# Patient Record
Sex: Female | Born: 1955
Health system: Southern US, Community
[De-identification: ages and names within clinical notes are randomized; demographics above are authoritative.]

## PROBLEM LIST (undated history)

## (undated) DIAGNOSIS — M1990 Unspecified osteoarthritis, unspecified site: Secondary | ICD-10-CM

## (undated) DIAGNOSIS — H409 Unspecified glaucoma: Secondary | ICD-10-CM

## (undated) DIAGNOSIS — E7800 Pure hypercholesterolemia, unspecified: Secondary | ICD-10-CM

## (undated) DIAGNOSIS — E042 Nontoxic multinodular goiter: Secondary | ICD-10-CM

## (undated) DIAGNOSIS — K589 Irritable bowel syndrome without diarrhea: Secondary | ICD-10-CM

## (undated) DIAGNOSIS — I10 Essential (primary) hypertension: Secondary | ICD-10-CM

## (undated) DIAGNOSIS — M545 Low back pain, unspecified: Secondary | ICD-10-CM

## (undated) DIAGNOSIS — C649 Malignant neoplasm of unspecified kidney, except renal pelvis: Secondary | ICD-10-CM

## (undated) DIAGNOSIS — K219 Gastro-esophageal reflux disease without esophagitis: Secondary | ICD-10-CM

## (undated) DIAGNOSIS — T7840XA Allergy, unspecified, initial encounter: Secondary | ICD-10-CM

## (undated) DIAGNOSIS — E663 Overweight: Secondary | ICD-10-CM

## (undated) DIAGNOSIS — I872 Venous insufficiency (chronic) (peripheral): Secondary | ICD-10-CM

## (undated) HISTORY — DX: Unspecified glaucoma: H40.9

## (undated) HISTORY — DX: Malignant neoplasm of unspecified kidney, except renal pelvis: C64.9

## (undated) HISTORY — DX: Overweight: E66.3

## (undated) HISTORY — DX: Essential (primary) hypertension: I10

## (undated) HISTORY — DX: Nontoxic multinodular goiter: E04.2

## (undated) HISTORY — DX: Pure hypercholesterolemia, unspecified: E78.00

## (undated) HISTORY — DX: Allergy, unspecified, initial encounter: T78.40XA

## (undated) HISTORY — PX: CHOLECYSTECTOMY: SHX55

## (undated) HISTORY — DX: Low back pain: M54.5

## (undated) HISTORY — PX: ABDOMINAL HYSTERECTOMY: SHX81

## (undated) HISTORY — DX: Venous insufficiency (chronic) (peripheral): I87.2

## (undated) HISTORY — DX: Unspecified osteoarthritis, unspecified site: M19.90

## (undated) HISTORY — DX: Low back pain, unspecified: M54.50

## (undated) HISTORY — DX: Gastro-esophageal reflux disease without esophagitis: K21.9

## (undated) HISTORY — DX: Irritable bowel syndrome, unspecified: K58.9

---

## 1998-12-30 ENCOUNTER — Other Ambulatory Visit: Admission: RE | Admit: 1998-12-30 | Discharge: 1998-12-30 | Payer: Self-pay | Admitting: Obstetrics and Gynecology

## 1999-01-09 ENCOUNTER — Observation Stay (HOSPITAL_COMMUNITY): Admission: RE | Admit: 1999-01-09 | Discharge: 1999-01-10 | Payer: Self-pay | Admitting: Obstetrics and Gynecology

## 2000-06-19 ENCOUNTER — Emergency Department (HOSPITAL_COMMUNITY): Admission: EM | Admit: 2000-06-19 | Discharge: 2000-06-19 | Payer: Self-pay | Admitting: Emergency Medicine

## 2000-08-07 ENCOUNTER — Encounter: Payer: Self-pay | Admitting: Obstetrics and Gynecology

## 2000-08-07 ENCOUNTER — Encounter: Admission: RE | Admit: 2000-08-07 | Discharge: 2000-08-07 | Payer: Self-pay | Admitting: Obstetrics and Gynecology

## 2001-09-08 ENCOUNTER — Encounter: Payer: Self-pay | Admitting: Obstetrics and Gynecology

## 2001-09-08 ENCOUNTER — Encounter: Admission: RE | Admit: 2001-09-08 | Discharge: 2001-09-08 | Payer: Self-pay | Admitting: Obstetrics and Gynecology

## 2002-08-19 ENCOUNTER — Other Ambulatory Visit: Admission: RE | Admit: 2002-08-19 | Discharge: 2002-08-19 | Payer: Self-pay | Admitting: Obstetrics and Gynecology

## 2003-01-25 ENCOUNTER — Encounter: Admission: RE | Admit: 2003-01-25 | Discharge: 2003-01-25 | Payer: Self-pay | Admitting: Obstetrics and Gynecology

## 2003-01-25 ENCOUNTER — Encounter: Payer: Self-pay | Admitting: Obstetrics and Gynecology

## 2003-11-27 HISTORY — PX: OTHER SURGICAL HISTORY: SHX169

## 2004-03-13 ENCOUNTER — Encounter: Admission: RE | Admit: 2004-03-13 | Discharge: 2004-03-13 | Payer: Self-pay | Admitting: Obstetrics and Gynecology

## 2004-07-25 ENCOUNTER — Encounter (HOSPITAL_COMMUNITY): Admission: RE | Admit: 2004-07-25 | Discharge: 2004-08-22 | Payer: Self-pay | Admitting: Pulmonary Disease

## 2004-08-04 ENCOUNTER — Ambulatory Visit (HOSPITAL_COMMUNITY): Admission: RE | Admit: 2004-08-04 | Discharge: 2004-08-04 | Payer: Self-pay | Admitting: Pulmonary Disease

## 2004-08-10 ENCOUNTER — Ambulatory Visit (HOSPITAL_COMMUNITY): Admission: RE | Admit: 2004-08-10 | Discharge: 2004-08-10 | Payer: Self-pay | Admitting: Pulmonary Disease

## 2004-08-29 ENCOUNTER — Ambulatory Visit (HOSPITAL_COMMUNITY): Admission: RE | Admit: 2004-08-29 | Discharge: 2004-08-29 | Payer: Self-pay | Admitting: Urology

## 2004-09-11 ENCOUNTER — Inpatient Hospital Stay (HOSPITAL_COMMUNITY): Admission: RE | Admit: 2004-09-11 | Discharge: 2004-09-14 | Payer: Self-pay | Admitting: Urology

## 2004-09-11 ENCOUNTER — Encounter (INDEPENDENT_AMBULATORY_CARE_PROVIDER_SITE_OTHER): Payer: Self-pay | Admitting: Specialist

## 2004-11-22 ENCOUNTER — Ambulatory Visit (HOSPITAL_COMMUNITY): Admission: RE | Admit: 2004-11-22 | Discharge: 2004-11-22 | Payer: Self-pay | Admitting: Urology

## 2004-11-23 ENCOUNTER — Ambulatory Visit: Payer: Self-pay | Admitting: Pulmonary Disease

## 2004-12-20 ENCOUNTER — Ambulatory Visit: Payer: Self-pay | Admitting: Pulmonary Disease

## 2005-02-21 ENCOUNTER — Ambulatory Visit: Payer: Self-pay | Admitting: Pulmonary Disease

## 2005-04-03 ENCOUNTER — Ambulatory Visit (HOSPITAL_COMMUNITY): Admission: RE | Admit: 2005-04-03 | Discharge: 2005-04-03 | Payer: Self-pay | Admitting: Urology

## 2005-04-10 ENCOUNTER — Encounter: Admission: RE | Admit: 2005-04-10 | Discharge: 2005-04-10 | Payer: Self-pay | Admitting: Pulmonary Disease

## 2005-04-20 ENCOUNTER — Encounter: Admission: RE | Admit: 2005-04-20 | Discharge: 2005-04-20 | Payer: Self-pay | Admitting: Pulmonary Disease

## 2005-05-08 ENCOUNTER — Ambulatory Visit: Payer: Self-pay | Admitting: Pulmonary Disease

## 2005-09-17 ENCOUNTER — Ambulatory Visit: Payer: Self-pay | Admitting: Pulmonary Disease

## 2005-10-03 ENCOUNTER — Ambulatory Visit (HOSPITAL_COMMUNITY): Admission: RE | Admit: 2005-10-03 | Discharge: 2005-10-03 | Payer: Self-pay | Admitting: Urology

## 2005-12-18 ENCOUNTER — Ambulatory Visit: Payer: Self-pay | Admitting: Pulmonary Disease

## 2006-02-05 ENCOUNTER — Ambulatory Visit: Payer: Self-pay | Admitting: Pulmonary Disease

## 2006-05-06 ENCOUNTER — Encounter: Admission: RE | Admit: 2006-05-06 | Discharge: 2006-05-06 | Payer: Self-pay | Admitting: Obstetrics and Gynecology

## 2006-05-21 ENCOUNTER — Ambulatory Visit: Payer: Self-pay | Admitting: Pulmonary Disease

## 2006-06-10 ENCOUNTER — Other Ambulatory Visit: Admission: RE | Admit: 2006-06-10 | Discharge: 2006-06-10 | Payer: Self-pay | Admitting: Obstetrics and Gynecology

## 2006-06-10 ENCOUNTER — Ambulatory Visit: Payer: Self-pay | Admitting: Pulmonary Disease

## 2006-10-01 ENCOUNTER — Ambulatory Visit (HOSPITAL_COMMUNITY): Admission: RE | Admit: 2006-10-01 | Discharge: 2006-10-01 | Payer: Self-pay | Admitting: Urology

## 2007-04-08 ENCOUNTER — Ambulatory Visit (HOSPITAL_COMMUNITY): Admission: RE | Admit: 2007-04-08 | Discharge: 2007-04-08 | Payer: Self-pay | Admitting: Urology

## 2007-06-06 ENCOUNTER — Ambulatory Visit: Payer: Self-pay | Admitting: Pulmonary Disease

## 2007-06-06 LAB — CONVERTED CEMR LAB
Alkaline Phosphatase: 78 units/L (ref 39–117)
BUN: 12 mg/dL (ref 6–23)
Bilirubin, Direct: 0.1 mg/dL (ref 0.0–0.3)
CO2: 27 meq/L (ref 19–32)
Calcium: 9.5 mg/dL (ref 8.4–10.5)
Creatinine, Ser: 1.1 mg/dL (ref 0.4–1.2)
Eosinophils Absolute: 0.2 10*3/uL (ref 0.0–0.6)
Eosinophils Relative: 2.3 % (ref 0.0–5.0)
Glucose, Bld: 95 mg/dL (ref 70–99)
Hemoglobin: 13.5 g/dL (ref 12.0–15.0)
Lymphocytes Relative: 23.8 % (ref 12.0–46.0)
MCV: 93.2 fL (ref 78.0–100.0)
Neutro Abs: 6.2 10*3/uL (ref 1.4–7.7)
Potassium: 4.6 meq/L (ref 3.5–5.1)
RBC: 4.17 M/uL (ref 3.87–5.11)
RDW: 11.8 % (ref 11.5–14.6)
Total Bilirubin: 0.7 mg/dL (ref 0.3–1.2)

## 2007-09-03 ENCOUNTER — Encounter: Admission: RE | Admit: 2007-09-03 | Discharge: 2007-09-03 | Payer: Self-pay | Admitting: Pulmonary Disease

## 2007-10-15 ENCOUNTER — Ambulatory Visit (HOSPITAL_COMMUNITY): Admission: RE | Admit: 2007-10-15 | Discharge: 2007-10-15 | Payer: Self-pay | Admitting: Urology

## 2008-01-20 DIAGNOSIS — I10 Essential (primary) hypertension: Secondary | ICD-10-CM | POA: Insufficient documentation

## 2008-01-20 DIAGNOSIS — M545 Low back pain, unspecified: Secondary | ICD-10-CM | POA: Insufficient documentation

## 2008-01-20 DIAGNOSIS — K219 Gastro-esophageal reflux disease without esophagitis: Secondary | ICD-10-CM | POA: Insufficient documentation

## 2008-01-20 DIAGNOSIS — K589 Irritable bowel syndrome without diarrhea: Secondary | ICD-10-CM | POA: Insufficient documentation

## 2008-06-07 ENCOUNTER — Telehealth (INDEPENDENT_AMBULATORY_CARE_PROVIDER_SITE_OTHER): Payer: Self-pay | Admitting: *Deleted

## 2008-07-19 ENCOUNTER — Telehealth: Payer: Self-pay | Admitting: Pulmonary Disease

## 2008-07-21 ENCOUNTER — Ambulatory Visit: Payer: Self-pay | Admitting: Pulmonary Disease

## 2008-07-21 DIAGNOSIS — E042 Nontoxic multinodular goiter: Secondary | ICD-10-CM | POA: Insufficient documentation

## 2008-07-21 DIAGNOSIS — M1990 Unspecified osteoarthritis, unspecified site: Secondary | ICD-10-CM | POA: Insufficient documentation

## 2008-07-22 LAB — CONVERTED CEMR LAB
AST: 19 units/L (ref 0–37)
Albumin: 3.5 g/dL (ref 3.5–5.2)
Alkaline Phosphatase: 80 units/L (ref 39–117)
BUN: 11 mg/dL (ref 6–23)
Basophils Relative: 0.6 % (ref 0.0–3.0)
Bilirubin, Direct: 0.1 mg/dL (ref 0.0–0.3)
Calcium: 8.9 mg/dL (ref 8.4–10.5)
Eosinophils Relative: 3.8 % (ref 0.0–5.0)
GFR calc non Af Amer: 62 mL/min
HDL: 35.4 mg/dL — ABNORMAL LOW (ref >39.0)
Ketones, ur: NEGATIVE mg/dL
LDL Cholesterol: 130 mg/dL — ABNORMAL HIGH (ref 0–99)
Lymphocytes Relative: 32.5 % (ref 12.0–46.0)
MCHC: 34.8 g/dL (ref 30.0–36.0)
MCV: 94.9 fL (ref 78.0–100.0)
Neutro Abs: 4.1 10*3/uL (ref 1.4–7.7)
Neutrophils Relative %: 58.3 % (ref 43.0–77.0)
Nitrite: NEGATIVE
Platelets: 257 10*3/uL (ref 150–400)
RBC: 4.1 M/uL (ref 3.87–5.11)
RDW: 12.2 % (ref 11.5–14.6)
Sodium: 142 meq/L (ref 135–145)
TSH: 0.27 microintl units/mL — ABNORMAL LOW (ref 0.35–5.50)
Total Bilirubin: 0.7 mg/dL (ref 0.3–1.2)
Total CHOL/HDL Ratio: 5.3
Urine Glucose: NEGATIVE mg/dL
VLDL: 22 mg/dL (ref 0–40)
pH: 8 (ref 5.0–8.0)

## 2008-07-29 ENCOUNTER — Telehealth (INDEPENDENT_AMBULATORY_CARE_PROVIDER_SITE_OTHER): Payer: Self-pay | Admitting: *Deleted

## 2008-07-30 ENCOUNTER — Telehealth (INDEPENDENT_AMBULATORY_CARE_PROVIDER_SITE_OTHER): Payer: Self-pay | Admitting: *Deleted

## 2008-09-07 ENCOUNTER — Encounter: Admission: RE | Admit: 2008-09-07 | Discharge: 2008-09-07 | Payer: Self-pay | Admitting: Obstetrics and Gynecology

## 2008-09-14 ENCOUNTER — Ambulatory Visit: Payer: Self-pay | Admitting: Pulmonary Disease

## 2008-09-17 ENCOUNTER — Encounter: Admission: RE | Admit: 2008-09-17 | Discharge: 2008-09-17 | Payer: Self-pay | Admitting: Obstetrics and Gynecology

## 2008-09-19 DIAGNOSIS — E7800 Pure hypercholesterolemia, unspecified: Secondary | ICD-10-CM | POA: Insufficient documentation

## 2008-12-04 ENCOUNTER — Encounter: Payer: Self-pay | Admitting: Pulmonary Disease

## 2009-02-14 ENCOUNTER — Telehealth (INDEPENDENT_AMBULATORY_CARE_PROVIDER_SITE_OTHER): Payer: Self-pay | Admitting: *Deleted

## 2009-02-17 ENCOUNTER — Ambulatory Visit: Payer: Self-pay | Admitting: Pulmonary Disease

## 2009-02-17 DIAGNOSIS — I872 Venous insufficiency (chronic) (peripheral): Secondary | ICD-10-CM | POA: Insufficient documentation

## 2009-02-17 DIAGNOSIS — E663 Overweight: Secondary | ICD-10-CM | POA: Insufficient documentation

## 2009-05-05 ENCOUNTER — Ambulatory Visit: Payer: Self-pay | Admitting: Pulmonary Disease

## 2009-05-05 DIAGNOSIS — L259 Unspecified contact dermatitis, unspecified cause: Secondary | ICD-10-CM | POA: Insufficient documentation

## 2009-05-09 LAB — CONVERTED CEMR LAB
ALT: 25 units/L (ref 0–35)
Albumin: 3.9 g/dL (ref 3.5–5.2)
Alkaline Phosphatase: 82 units/L (ref 39–117)
Basophils Absolute: 0.5 10*3/uL — ABNORMAL HIGH (ref 0.0–0.1)
Basophils Relative: 5.6 % — ABNORMAL HIGH (ref 0.0–3.0)
Bilirubin, Direct: 0.1 mg/dL (ref 0.0–0.3)
CO2: 30 meq/L (ref 19–32)
Lymphs Abs: 2.3 10*3/uL (ref 0.7–4.0)
MCHC: 34.3 g/dL (ref 30.0–36.0)
Monocytes Absolute: 0.4 10*3/uL (ref 0.1–1.0)
Monocytes Relative: 5.4 % (ref 3.0–12.0)
Platelets: 140 10*3/uL — ABNORMAL LOW (ref 150.0–400.0)
Potassium: 3.5 meq/L (ref 3.5–5.1)
Total CK: 115 units/L (ref 7–177)
Total Protein: 8 g/dL (ref 6.0–8.3)
WBC: 8.2 10*3/uL (ref 4.5–10.5)

## 2009-10-11 ENCOUNTER — Telehealth: Payer: Self-pay | Admitting: Pulmonary Disease

## 2009-10-19 ENCOUNTER — Encounter: Admission: RE | Admit: 2009-10-19 | Discharge: 2009-10-19 | Payer: Self-pay | Admitting: Obstetrics and Gynecology

## 2009-12-12 ENCOUNTER — Telehealth: Payer: Self-pay | Admitting: Pulmonary Disease

## 2010-04-28 ENCOUNTER — Ambulatory Visit (HOSPITAL_COMMUNITY): Admission: RE | Admit: 2010-04-28 | Discharge: 2010-04-28 | Payer: Self-pay | Admitting: Urology

## 2010-04-28 ENCOUNTER — Encounter: Payer: Self-pay | Admitting: Pulmonary Disease

## 2010-12-17 ENCOUNTER — Encounter: Payer: Self-pay | Admitting: Obstetrics and Gynecology

## 2010-12-17 ENCOUNTER — Encounter: Payer: Self-pay | Admitting: Pulmonary Disease

## 2010-12-18 ENCOUNTER — Encounter
Admission: RE | Admit: 2010-12-18 | Discharge: 2010-12-18 | Payer: Self-pay | Source: Home / Self Care | Attending: Obstetrics and Gynecology | Admitting: Obstetrics and Gynecology

## 2010-12-26 NOTE — Progress Notes (Signed)
Summary: rx  Phone Note Call from Patient Call back at [phone]   Caller: Patient Call For: Frances Ambrosino Reason for Call: Talk to Nurse Summary of Call: pt would like a $4.00 alternative from [facility] $4 list  for her Bisiprolol called in to Pharmacy. [facility] Initial call taken by: Eugene Gavia,  December 12, 2009 9:55 AM  Follow-up for Phone Call        please advise. Carron Curie CMA  December 12, 2009 9:59 AM    per SN---change to  bisoprolol/hctz 5/6.25mg    #30  1 by mouth once daily refill as needed ... thanks Randell Loop CMA  December 12, 2009 12:38 PM   rx sent. pt aware.Carron Curie CMA  December 12, 2009 1:01 PM     New/Updated Medications: BISOPROLOL-HYDROCHLOROTHIAZIDE 5-6.25 MG TABS (BISOPROLOL-HYDROCHLOROTHIAZIDE) Take 1 tablet by mouth once a day Prescriptions: BISOPROLOL-HYDROCHLOROTHIAZIDE 5-6.25 MG TABS (BISOPROLOL-HYDROCHLOROTHIAZIDE) Take 1 tablet by mouth once a day  #30 x prn   Entered by:   Carron Curie CMA   Authorized by:   Michele Mcalpine MD   Signed by:   Carron Curie CMA on 12/12/2009   Method used:   Electronically to        [facility]* (retail)       [address] [place], [place]  [address]       Ph: [phone]       Fax: [phone]   RxID:   [id number]

## 2010-12-26 NOTE — Letter (Signed)
[facility]: [facility]   Imported By: Sherian Rein 05/15/2010 12:08:09  _____________________________________________________________________  External Attachment:    Type:   Image     Comment:   External Document

## 2011-01-16 ENCOUNTER — Telehealth: Payer: Self-pay | Admitting: Pulmonary Disease

## 2011-01-23 NOTE — Progress Notes (Signed)
Summary: fecal occult test//sh  Phone Note Call from Patient   Caller: Patient Call For: Nikiya Starn Summary of Call: Patient phoned stated that she needed fecal oocult for her wellness plan at work. Patient wants to know if she can just come by and get the card. She has an appt for follow up and med refills on 02/07/11. Patient can be reached at [phone] Initial call taken by: Vedia Coffer,  January 16, 2011 2:29 PM  Follow-up for Phone Call        Is this okay for her to com and pick up stool cards? Pls advise, thanks! Follow-up by: Vernie Murders,  January 16, 2011 3:09 PM  Additional Follow-up for Phone Call Additional follow up Details #1::        per SN---this is ok for pt to have the stool cards.  these have been left up front for pt to pick these up---lmomtcb for pt Randell Loop [facility]  January 16, 2011 4:33 PM     Additional Follow-up for Phone Call Additional follow up Details #2::    pt returned my call---she is aware that the stool cards have been left up front for her to come by and pick up. Randell Loop CMA  January 17, 2011 9:40 AM

## 2011-02-06 ENCOUNTER — Other Ambulatory Visit: Payer: Self-pay | Admitting: Pulmonary Disease

## 2011-02-06 ENCOUNTER — Other Ambulatory Visit: Payer: Self-pay

## 2011-02-06 ENCOUNTER — Encounter (INDEPENDENT_AMBULATORY_CARE_PROVIDER_SITE_OTHER): Payer: Self-pay | Admitting: *Deleted

## 2011-02-06 DIAGNOSIS — Z0000 Encounter for general adult medical examination without abnormal findings: Secondary | ICD-10-CM

## 2011-02-06 LAB — HEMOCCULT SLIDES (X 3 CARDS)
Fecal Occult Blood: NEGATIVE
OCCULT 1: NEGATIVE
OCCULT 4: NEGATIVE
OCCULT 5: NEGATIVE

## 2011-02-07 ENCOUNTER — Ambulatory Visit (INDEPENDENT_AMBULATORY_CARE_PROVIDER_SITE_OTHER): Payer: BC Managed Care – PPO | Admitting: Pulmonary Disease

## 2011-02-07 ENCOUNTER — Encounter: Payer: Self-pay | Admitting: Pulmonary Disease

## 2011-02-07 DIAGNOSIS — C649 Malignant neoplasm of unspecified kidney, except renal pelvis: Secondary | ICD-10-CM

## 2011-02-07 DIAGNOSIS — K589 Irritable bowel syndrome without diarrhea: Secondary | ICD-10-CM

## 2011-02-07 DIAGNOSIS — I10 Essential (primary) hypertension: Secondary | ICD-10-CM

## 2011-02-07 DIAGNOSIS — K219 Gastro-esophageal reflux disease without esophagitis: Secondary | ICD-10-CM

## 2011-02-07 DIAGNOSIS — E042 Nontoxic multinodular goiter: Secondary | ICD-10-CM

## 2011-02-07 DIAGNOSIS — E7800 Pure hypercholesterolemia, unspecified: Secondary | ICD-10-CM

## 2011-02-07 DIAGNOSIS — E663 Overweight: Secondary | ICD-10-CM

## 2011-02-07 DIAGNOSIS — I872 Venous insufficiency (chronic) (peripheral): Secondary | ICD-10-CM

## 2011-02-08 ENCOUNTER — Other Ambulatory Visit: Payer: BC Managed Care – PPO

## 2011-02-08 ENCOUNTER — Encounter (INDEPENDENT_AMBULATORY_CARE_PROVIDER_SITE_OTHER): Payer: Self-pay | Admitting: *Deleted

## 2011-02-08 ENCOUNTER — Other Ambulatory Visit: Payer: Self-pay | Admitting: Pulmonary Disease

## 2011-02-08 DIAGNOSIS — Z0000 Encounter for general adult medical examination without abnormal findings: Secondary | ICD-10-CM

## 2011-02-08 LAB — BASIC METABOLIC PANEL
CO2: 31 mEq/L (ref 19–32)
Calcium: 9.5 mg/dL (ref 8.4–10.5)
Chloride: 103 mEq/L (ref 96–112)
Creatinine, Ser: 1.2 mg/dL (ref 0.4–1.2)
Glucose, Bld: 87 mg/dL (ref 70–99)
Sodium: 141 mEq/L (ref 135–145)

## 2011-02-08 LAB — CBC WITH DIFFERENTIAL/PLATELET
Basophils Absolute: 0 10*3/uL (ref 0.0–0.1)
Eosinophils Absolute: 0.3 10*3/uL (ref 0.0–0.7)
Lymphocytes Relative: 26.6 % (ref 12.0–46.0)
MCHC: 34.3 g/dL (ref 30.0–36.0)
Monocytes Relative: 6.7 % (ref 3.0–12.0)
Neutrophils Relative %: 62.8 % (ref 43.0–77.0)
RDW: 13.2 % (ref 11.5–14.6)

## 2011-02-08 LAB — URINALYSIS
Hgb urine dipstick: NEGATIVE
Leukocytes, UA: NEGATIVE
Nitrite: NEGATIVE
Specific Gravity, Urine: 1.015 (ref 1.000–1.030)
Urobilinogen, UA: 1 (ref 0.0–1.0)

## 2011-02-08 LAB — HEPATIC FUNCTION PANEL
ALT: 18 U/L (ref 0–35)
Albumin: 3.9 g/dL (ref 3.5–5.2)
Alkaline Phosphatase: 66 U/L (ref 39–117)
Bilirubin, Direct: 0.1 mg/dL (ref 0.0–0.3)
Total Protein: 7.4 g/dL (ref 6.0–8.3)

## 2011-02-08 LAB — TSH: TSH: 0.18 u[IU]/mL — ABNORMAL LOW (ref 0.35–5.50)

## 2011-02-08 LAB — LIPID PANEL
HDL: 42.8 mg/dL (ref >39.00)
Total CHOL/HDL Ratio: 4
Triglycerides: 148 mg/dL (ref 0.0–149.0)

## 2011-02-12 ENCOUNTER — Telehealth: Payer: Self-pay | Admitting: Pulmonary Disease

## 2011-02-20 ENCOUNTER — Telehealth: Payer: Self-pay | Admitting: Pulmonary Disease

## 2011-02-20 NOTE — Telephone Encounter (Signed)
Leigh please advise, I'm not seeing anything in epic where you tried to contact pt. Thanks  Carver Fila, CMA

## 2011-02-20 NOTE — Telephone Encounter (Signed)
lmomtcb for pt to let us know which pharmacy to send in her refills of bisoprolol 5mg    Once daily  #90 with 4 refills.

## 2011-02-20 NOTE — Telephone Encounter (Signed)
Called and spoke with pt and she is aware per SN---if her insurance will not cover the bisoprol 5 daily then we can change to another beta blocker---since she is on the lasix she does not need to take the bisoprol/hctz--pt is aware to finish what meds she has left and she will call once these are complete.

## 2011-02-22 NOTE — Progress Notes (Signed)
Summary: lab results/med change on synthroid       New/Updated Medications: SYNTHROID 75 MCG TABS (LEVOTHYROXINE SODIUM) take one tablet by mouth once daily Prescriptions: SYNTHROID 75 MCG TABS (LEVOTHYROXINE SODIUM) take one tablet by mouth once daily  #90 x 3   Entered by:   Randell Loop CMA   Authorized by:   Michele Mcalpine MD   Signed by:   Randell Loop CMA on 02/12/2011   Method used:   Electronically to        [facility] [phone]* (retail)       [address]       [place], [place]  [address]       Ph: [phone] or [phone]       Fax: [phone]   RxID:   [id number]  pt returned my call today about her lab results----per SN---chol looks good on the simvastatin 20mg  so keep this the same----tsh was over suppressed on the synthroid 88 so recs to change to synthroid daily and this has been sent to pts pharmacy and pt is aware Randell Loop CMA  February 12, 2011 4:30 PM

## 2011-02-27 NOTE — Assessment & Plan Note (Signed)
Summary: 6 MONTH ROV MED REFILLS//SH   Primary Care Provider:  Dr. Kriste Basque  CC:  2 year ROV & review of mult medical problems....  History of Present Illness: 55 y/o BF here for a follow up visit... she has multiple medical problems including HBP;  Hyperchol;  Multinod Thyroid;  GERD/    ~  February 17, 2009:   1) presents w/ a tiny BB sized knot in the skin and subcut fat of the left bicep area... not tender, not red/ inflammed, not draining, etc- she just noticed it & was concerned... pt reassured that it appears to be a small cyst/ nodule & can safely be watched... she is asked to call for referral to Derm vs Surg if it enlarges, becomes painful, drains, etc...  2) c/o TSH low on the labs she had done at work... I reviewed her prev eval w/ multinod goiter dx & rec for Synthroid suppression... prev TSH here 0.16 - 0.27 on 25mcg/d & rec to continue same... she will send copy of her labs for [place] to review (TSH= 0.42, T4= 11.1, T3uptake= 29, FTI= 3.2).Marland KitchenMarland Kitchen 3) she wants a diet pill and I told her that, in my opinion, there are no safe/ effective diet pills out there... rec wt watcher's etc...   ~  February 07, 2011:  17yr ROV and she's under some stress w/ husb open heart surg but overall stable...    HBP>  BP controlled on Bisoprolol, Amlodipine, & Lasix; BP= 116/82 & denies CP, palpit, SOB, edema...    Chol>  on Simva20 + diet & FLP shows TChol 161, TG 148, HDL 43, LDL 89...    Thyroid>  she has multinod thyroid on Synthroid88 and TSH= 0.18 so we will decr the dose to 66mcg/d.    Renal>  she is s/p left nephrectomy 2005 for renal cell ca;  saw DrGrapey 6/11 for f/u & he reminded her of need for CXR/ Labs;  last CXR= 6/11 clear lungs, no mets;  Labs show BUN 16, Creat 1.2, LFTs norm...      HYPERTENSION (ICD-401.9) - on BISOPROLOL 5mg /d,  AMLODIPNE 10mg /d & LASIX 20mg /d... she is ACE intolerant... BP= 136/88 today and she reports 120's/ 70's at home... denies HA, fatigue, visual changes, CP, palipit,  dizziness, syncope, dyspnea, edema, etc... since she has only one kidney we discussed better control needed...  VENOUS INSUFFICIENCY (ICD-459.81) - she has mild VI and knows to avoid sodium, elevate legs, wear support hose, etc...  HYPERCHOLESTEROLEMIA (ICD-272.0) - on SIMVASTATIN 20mg /d started 8/09...  ~  FLP 6/07 showed TChol 187, TG 108, HDL 38, LDL 128... she prefered diet alone.  ~  FLP 8/09 showed TChol 187, TG 109, HDL 35, LDL 130... rec to start Simvastatin20.  ~  labs at [facility] 1/10 = TChol 153, TG 70, HDL 47, LDL 92... rec> continue Simva20...  NONTOXIC MULTINODULAR GOITER (ICD-241.1) - on SYNTHROID 65mcg/d... hx palp thyroid on exam w/ eval in 2005 showing inhomogeneous uptake bilat w/ sonar showing at least 6 sm nodules- mixed solid & cystic lesions c/w multinodular gland... Synthroid suppression started and dose adjusted...  ~  labs 7/08 showed TSH= 0.16  ~  labs 8/09 showed TSH= 0.27  ~  labs 1/10 at [facility] = TSH= 0.42, T4= 11.1, T3uptake= 29, FTI= 3.2.[name]. rec> continue same dose.  OVERWEIGHT (ICD-278.02)  GERD (ICD-530.81) - min reflux symptoms intermittently- OTC Rx as needed.  IRRITABLE BOWEL SYNDROME (ICD-564.1) - she had a neg colonoscopy in 1989 by DrPatterson (referred  by DrConnelly for rectal bleeding at that time)...  RENAL CELL CANCER (ICD-189.0) - she had an incidental left renal mass found on CTAbd done as part of a research protocol at [facility] in 2005 (her brother had DM and they were studying the sibling)...  5x6cm left lower pole mass found and removed by DrGrapey 10/05= renal cell cancer... she sees DrGrapey yearly now- last CTAbd in 2007 was OK... CXR & Labs here w/ copy to DrGrapey...  DEGENERATIVE JOINT DISEASE (ICD-715.90) - she has had LBP, left hip pain, and shoulder discomfort... all Rx w/ MOBIC 7.5mg  Prn...  BACK PAIN, LUMBAR (ICD-724.2)  Health Maintenance - her GYN is DrStringer and she is s/p hysterectomy, due for f/u GYN eval soon... she gets  her Mammograms at the [facility] and is up-to-date...   Preventive Screening-Counseling & Management  Alcohol-Tobacco     Smoking Status: never  Allergies: 1)  ! Morphine 2)  ! Sulfa  Comments:  Nurse/Medical Assistant: The patient's medications and allergies were reviewed with the patient and were updated in the Medication and Allergy Lists.  Past History:  Past Medical History: HYPERTENSION (ICD-401.9) VENOUS INSUFFICIENCY (ICD-459.81) HYPERCHOLESTEROLEMIA (ICD-272.0) NONTOXIC MULTINODULAR GOITER (ICD-241.1) OVERWEIGHT (ICD-278.02) GERD (ICD-530.81) IRRITABLE BOWEL SYNDROME (ICD-564.1) RENAL CELL CANCER (ICD-189.0) DEGENERATIVE JOINT DISEASE (ICD-715.90) BACK PAIN, LUMBAR (ICD-724.2)  Past Surgical History: S/P hysterectomy S/P left nephrectomy for renal cell cancer in 2005 by DrGrapey  Family History: Reviewed history from 07/21/2008 and no changes required. MOTHER DECEASED AGE 60 FROM STROKE FATHER DECEASED AGE 42 FROM STROKE Brother w/ DM  Social History: Reviewed history from 07/21/2008 and no changes required. NON SMOKER NO EXERCISE CAFFEINE USE:  2 CUPS PER DAY NO ETOH MARRIED 2 CHILDREN  Review of Systems      See HPI  The patient denies anorexia, fever, weight loss, weight gain, vision loss, decreased hearing, hoarseness, chest pain, syncope, dyspnea on exertion, peripheral edema, prolonged cough, headaches, hemoptysis, abdominal pain, melena, hematochezia, severe indigestion/heartburn, hematuria, incontinence, muscle weakness, suspicious skin lesions, transient blindness, difficulty walking, depression, unusual weight change, abnormal bleeding, enlarged lymph nodes, and angioedema.    Vital Signs:  Patient profile:   55 year old female Height:      62 inches Weight:      170.38 pounds BMI:     31.28 O2 Sat:      98 % on Room air Temp:     97.7 degrees F oral Pulse rate:   58 / minute BP sitting:   116 / 82  (right arm) Cuff size:    regular  Vitals Entered By: Randell Loop CMA (February 07, 2011 3:34 PM)  O2 Sat at Rest %:  98 O2 Flow:  Room air CC: 2 year ROV & review of mult medical problems... Is Patient Diabetic? No Pain Assessment Patient in pain? no      Comments meds updated today with pt   Physical Exam  Additional Exam:  WD, WN, 54 y/o BF in NAD... GENERAL:  Alert & oriented; pleasant & cooperative... HEENT:  [place]/AT, EOM-wnl, PERRLA, Glasses, EACs-clear, TMs-wnl, NOSE-clear, THROAT-clear & wnl. NECK:  Supple w/ full ROM; no JVD; normal carotid impulses w/o bruits; palp thyroid w/ nodular feel; no lymphadenopathy. CHEST:  Clear to P & A; without wheezes/ rales/ or rhonchi heard... HEART:  Regular Rhythm; without murmurs/ rubs/ or gallops detected... ABDOMEN:  Soft & nontender; normal bowel sounds; no organomegaly or masses palpated... EXT: without deformities or arthritic changes; no varicose veins/ venous insuffic/ or edema.  NEURO:  CN's intact; motor testing normal; sensory testing normal; gait normal & balance OK. DERM:   fine macular/papular rash along arm, torso and legs. no vesicles or excoriations noted.    Impression & Recommendations:  Problem # 1:  HYPERTENSION (ICD-401.9) Controlled on meds> continue same... Her updated medication list for this problem includes:    Bisoprolol Fumarate 5 Mg Tabs (Bisoprolol fumarate) .[name]... Take 1 tab by mouth once daily...    Amlodipine Besylate 10 Mg Tabs (Amlodipine besylate) .[name]... Take 1 tab by mouth once daily...    Furosemide 20 Mg Tabs (Furosemide) .[name]... Take 1 tablet by mouth once a day  Problem # 2:  HYPERCHOLESTEROLEMIA (ICD-272.0) Stable on low dose Simva + diet... continue same. Her updated medication list for this problem includes:    Simvastatin 20 Mg Tabs (Simvastatin) .[name]... Take 1 tab by mouth at bedtime  Problem # 3:  NONTOXIC MULTINODULAR GOITER (ICD-241.1) TSH was oversuppressed on 48mcg/d so we decr her to dose...  Problem #  4:  GERD (ICD-530.81) GI stable & she is due for routine colonoscopy per DrPatterson;  she will call to set this up at her convenience.  Problem # 5:  RENAL CELL CANCER (ICD-189.0) Followed by DrGrapey & doing well w/ neg f/u CXR 6/11...  Problem # 6:  DEGENERATIVE JOINT DISEASE (ICD-715.90) Stable on NSAID rx Prn... Her updated medication list for this problem includes:    Meloxicam 7.5 Mg Tabs (Meloxicam) .[name]... Take 1 tab by mouth once daily as needed for asrthritis pain...  Problem # 7:  OTHER PROBLEMS AS NOTED>>>  Complete Medication List: 1)  Bisoprolol Fumarate 5 Mg Tabs (Bisoprolol fumarate) .... Take 1 tab by mouth once daily.Marland KitchenMarland Kitchen 2)  Amlodipine Besylate 10 Mg Tabs (Amlodipine besylate) .... Take 1 tab by mouth once daily.Marland KitchenMarland Kitchen 3)  Furosemide 20 Mg Tabs (Furosemide) .... Take 1 tablet by mouth once a day 4)  Simvastatin 20 Mg Tabs (Simvastatin) .... Take 1 tab by mouth at bedtime 5)  Synthroid 75 Mcg Tabs (Levothyroxine sodium) .... Take one tablet by mouth once daily 6)  Meloxicam 7.5 Mg Tabs (Meloxicam) .... Take 1 tab by mouth once daily as needed for asrthritis pain.Marland KitchenMarland Kitchen 7)  Vitamin D 1000 Unit Caps (Cholecalciferol) .... Take 1 tablet by mouth once a day  Patient Instructions: 1)  Today we updated your med list- see below.... 2)  We refilled your meds for 90d supplies & refillable for the yr... 3)  Please return to our lab one morning soon for your FASTING blood work... please call the "phone tree" in a few days for your lab results.Marland KitchenMarland Kitchen  4)  Your last CXR was 6/11 by drGrapey & it was OK... 5)  Keep up the good job of diet + exercise> try to lose another 10-15 lbs... 6)  Call for any problems.Marland KitchenMarland Kitchen 7)  Please schedule a follow-up appointment in 1 year, sooner as needed... Prescriptions: BISOPROLOL FUMARATE 5 MG TABS (BISOPROLOL FUMARATE) take 1 tab by mouth once daily...  #90 x 4   Entered and Authorized by:   Michele Mcalpine MD   Signed by:   Michele Mcalpine MD on 02/18/2011   Method  used:   Print then Give to Patient   RxID:   [id number] MELOXICAM 7.5 MG TABS (MELOXICAM) take 1 tab by mouth once daily as needed for asrthritis pain...  #90 x 4   Entered and Authorized by:   Michele Mcalpine MD   Signed by:  Michele Mcalpine MD on 02/07/2011   Method used:   Print then Give to Patient   RxID:   [id number] SYNTHROID 88 MCG  TABS (LEVOTHYROXINE SODIUM) Take 1 tablet by mouth once a day  #90 x 4   Entered and Authorized by:   Michele Mcalpine MD   Signed by:   Michele Mcalpine MD on 02/07/2011   Method used:   Print then Give to Patient   RxID:   [id number] SIMVASTATIN 20 MG TABS (SIMVASTATIN) Take 1 tab by mouth at bedtime  #90 x 4   Entered and Authorized by:   Michele Mcalpine MD   Signed by:   Michele Mcalpine MD on 02/07/2011   Method used:   Print then Give to Patient   RxID:   [id number] FUROSEMIDE 20 MG  TABS (FUROSEMIDE) Take 1 tablet by mouth once a day  #90 x 4   Entered and Authorized by:   Michele Mcalpine MD   Signed by:   Michele Mcalpine MD on 02/07/2011   Method used:   Print then Give to Patient   RxID:   [id number] AMLODIPINE BESYLATE 10 MG TABS (AMLODIPINE BESYLATE) take 1 tab by mouth once daily...  #90 x 4   Entered and Authorized by:   Michele Mcalpine MD   Signed by:   Michele Mcalpine MD on 02/07/2011   Method used:   Print then Give to Patient   RxID:   [id number] BISOPROLOL-HYDROCHLOROTHIAZIDE 5-6.25 MG TABS (BISOPROLOL-HYDROCHLOROTHIAZIDE) Take 1 tablet by mouth once a day  #90 x 4   Entered and Authorized by:   Michele Mcalpine MD   Signed by:   Michele Mcalpine MD on 02/07/2011   Method used:   Print then Give to Patient   RxID:   [id number]    Immunization History:  Influenza Immunization History:    Influenza:  denied/never takes (02/07/2011)  Pneumovax Immunization History:    Pneumovax:  denied/never takes (02/07/2011)

## 2011-04-13 NOTE — H&P (Signed)
NAME:  Savannah Hubbard, LAGUARDIA NO.:  [id number]   MEDICAL RECORD NO.:  [id number]          PATIENT TYPE:  INP   LOCATION:  0010                         FACILITY:  [facility]   PHYSICIAN:  Valetta Fuller, M.D.  DATE OF BIRTH:  11-25-1956   DATE OF ADMISSION:  09/11/2004  DATE OF DISCHARGE:                                HISTORY & PHYSICAL   CHIEF COMPLAINT:  Left renal mass.   HISTORY OF PRESENT ILLNESS:  Savannah Hubbard is a 55 year old female.  The  patient was undergoing some screening radiologic images because of entry  into a familial study on diabetes and hypertension.  As part of that  evaluation, she had a CT scan which showed a left renal mass.  She  subsequently had a formal abdominal and pelvic CT scan of the abdomen at  [facility].  This revealed a 6 cm enhancing mass off the medial  aspect of the lower pole left kidney consistent with renal cell carcinoma.  She had no evidence of metastatic disease.  The renal vein did not appear to  be involved.   The patient underwent extensive counseling in our office. We felt, given the  size of the lesion and the suspicious nature, that left nephrectomy was  certainly indicated. We talked about the pros and cons of hand-assisted  laparoscopic approach versus traditional open nephrectomy.  The patient has  elected to attempt a hand-assisted laparoscopic left nephrectomy.  Again,  she has been otherwise asymptomatic.  She presents now for that procedure.   PAST MEDICAL HISTORY:  Hypertension.  This is generally well controlled on  Maxzide and Avapro.   ALLERGIES:  No known drug allergies.   PAST SURGICAL HISTORY:  1.  Hysterectomy.  2.  Cholecystectomy.  3.  Two cesarean sections in the past.   SOCIAL HISTORY:  She is a nonsmoker.   FAMILY HISTORY:  There is a family history of hypertension, heart disease,  and diabetes, as well as sickle trait.   PHYSICAL EXAMINATION:  GENERAL:  Well-developed, well-nourished  female.  VITAL SIGNS:  Height approximately 5 feet 1 inch, current weight 163 pounds.  Blood pressure on admission was 128/78 with a pulse of 72, and she was  afebrile.  NECK:  No obvious JVD or masses.  CHEST: Clear to auscultation.  ABDOMEN:  Soft with a number of well-healed incisions. There were no  palpable masses.  EXTREMITIES:  No edema.   DATA:  The patient's hemoglobin was 14.1.  BUN and creatinine 10 and 1.0,  respectively.   IMPRESSION:  Left renal mass, 6 cm, with enhancement.  This is almost  certainly a renal cell carcinoma.  The patient is to undergo attempt at hand-  assisted laparoscopic nephrectomy today with possible conversion to open if  necessary.  She will hopefully be admitted for routine postoperative care.      DSG/MEDQ  D:  09/11/2004  T:  09/11/2004  Job:  [id number]

## 2011-04-13 NOTE — Op Note (Signed)
NAMESAUSHA, Savannah Hubbard NO.:  [id number]   MEDICAL RECORD NO.:  [id number]          PATIENT TYPE:  INP   LOCATION:  0366                         [facility]ACILITY[facility]  [facility]   PHYSICIAN:  Valetta Fuller, M.D.  DATE OF BIRTH:  1956-08-19   DATE OF PROCEDURE:  09/11/2004  DATE OF DISCHARGE:                                 OPERATIVE REPORT   PREOPERATIVE DIAGNOSIS:  Left renal mass.   POSTOPERATIVE DIAGNOSES:  Left renal mass.   PROCEDURE PERFORMED:  Hand-assisted laparoscopic left radical nephrectomy.   SURGEON:  Barron Alvine, MD   ASSISTANT:  Bailey Mech, MD   ANESTHESIA:  General endotracheal.   INDICATIONS:  Ms. Hungate is a 55 year old female.  She underwent some  abdominal imaging studies as part of a familial diabetes and heart disease  study at [facility].  This revealed a suspicious 6 cm enhancing mass.  She subsequently had a formal abdominal pelvic CT at [facility].  Again, this showed an enhancing 6 cm lesion involving the lower  pole of the left kidney which radiographically was consistent with a renal  cell carcinoma.  There was no evidence of obvious metastatic disease.  Contralateral kidney was normal as was systemic renal function.  We felt  that this needed to be removed via nephrectomy.  We felt the tumor was too  large to justify a partial nephrectomy in this setting .  We recommend  consideration for a hand-assisted approach, and she agreed to that.  She  appeared to understand the advantages and disadvantages and risks of this  type of surgery.   TECHNIQUE AND FINDINGS:  The patient was brought to the operating room where  she had successful induction of general anesthesia.  A Foley catheter was  placed.  Utilizing the beanbag, the patient was rotated to approximately 30-  45 degrees.  The lower extremities were carefully padded.  The beanbag was  used for stabilization.  She was then prepped and draped in the usual  manner  after again, great care was taken to pad and protect all extremities.  We  then performed a 6 cm midline incision just above her umbilicus.  The  peritoneal cavity was entered, and no obvious adhesions were appreciated.  A  GelPort was utilized, and pneumoperitoneum was established.  With the camera  in place, we placed a 12 mm trocar in the left abdominal quadrant lateral to  the umbilicus.  We placed a second trocar with direct digital finger  control.  One was utilized as a Producer, television/film/video, the other as a working port.  We initiated the procedure by mobilizing the left colon, utilizing the  harmonic scalpel.  We then identified the lower pole of the kidney and by  tracing this medially, we were able to separate Gerota's fascia from the  left colon.  These planes were easily established.  Medial to the kidney in  the lower pole was indeed a 6 cm mass that could easily be palpated.  It did  not appear to be adherent or invading any other structures.  The ureter was  identified inferiorly and was clipped and then transected.  We then spent  out time freeing up the upper pole of the kidney.  This was detached from  any ligaments toward the spleen, and we came across the top of the kidney,  leaving the adrenal gland in situ, giving the lower pole position of this  tumor.  Once the lower and upper poles were freed and the colon was  completely mobilized over the anterior surface of the kidney, we went  posteriorly and completely freed up the kidney, really leaving just the  hilar attachments.  We could palpate a single, relatively large renal artery  just inferior to the vein.  This was carefully dissected free and then  clipped twice proximally.  The renal vein then collapsed very nicely.  We  used a GIA stapling device to staple across the vein.  We then used the  stapling device one additional time to go ahead across the artery, leaving  the two clips proximal and then the staple across  the rest of the artery.  The specimen was then removed.  Hemostasis was excellent.  We really had no  significant blood loss, and I would estimate it at less than a couple  hundred mL.  We carefully inspected all the trocar sites and were without  evidence of bleeding.  Careful inspection revealed no sites of any  significant oozing.  We then removed the specimen and the trocars and  instrumentation from the abdomen.  The midline incision was closed with #1  PDS suture.  We also reapproximated the peritoneum with some Vicryl suture.  Skin for all the incisions was then closed with clips.  The patient appeared  to tolerate the procedure well.  There were no obvious complications.  Sponge and needle counts were correct, and she was brought to the recovery  room in stable condition.      DSG/MEDQ  D:  09/11/2004  T:  09/11/2004  Job:  [id number]   cc:   Lonzo Cloud. Kriste Basque, M.D. [facility]

## 2011-04-13 NOTE — Discharge Summary (Signed)
NAMEMEKIYAH, GLADWELL NO.:  [id number]   MEDICAL RECORD NO.:  [id number]          PATIENT TYPE:  INP   LOCATION:  0366                         FACILITY:  [facility]   PHYSICIAN:  Valetta Fuller, M.D.  DATE OF BIRTH:  Oct 29, 1956   DATE OF ADMISSION:  09/11/2004  DATE OF DISCHARGE:  09/14/2004                                 DISCHARGE SUMMARY   DISCHARGE DIAGNOSES:  1.  Malignant neoplasm of the kidney.  2.  Hypertension.   PROCEDURE PERFORMED:  Hand-assisted laparoscopic left radical nephrectomy,  performed on September 11, 2004.   HOSPITAL COURSE:  Mrs. Mabry is a 55 year old female.  She had entered a  familial study on diabetes and hypertension through [facility].  As part of that evaluation, she had some imaging which showed a left renal  mass.  She was subsequently evaluated with a formal abdominal pelvic CT scan  at [facility] through her primary care physician, Dr. Alroy Dust.  This revealed a 6 cm enhancing mass of the medial aspect of the lower pole  of her left kidney consistent with renal cell carcinoma.  There was no  evidence of any locally advanced metastatic disease and the renal vein did  not appear to be involved.  The patient had undergone extensive counseling  in the office and we felt a radical nephrectomy was indicated.  We suggested  consideration for a laparoscopic approach.   The patient underwent uneventful, successful laparoscopic, hand-assisted  left radical nephrectomy on September 11, 2004.  Her initial postoperative  evaluation was unremarkable with stable vital signs and good urinary output.  On postoperative day #1, she remained afebrile with stable vital signs.  Her  hemoglobin was 11.8 and creatinine 1.3.  The patient did develop some  urinary retention initially after removal of her catheter, but she was able  to void.  Her exam remained unremarkable and she began taking some clear  liquids on postoperative day  #2.  By postoperative day #3, she complained  only of some mild incisional discomfort.  She had had a bowel movement and  was taking p.o. well.  Urine output remained excellent.  Her exam was  unremarkable.  The patient was discharged on postoperative day #3.   She was discharged home with some Vicodin.  Final pathology revealed a 5.6  cm tumor. 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  It was felt to be pathologic stage PT1.   DISPOSITION:  The patient is discharged home.  She will be followed up in  our office in approximately one week for stable removal.  She was given  routine postoperative/discharge instructions.      DSG/MEDQ  D:  10/30/2004  T:  10/31/2004  Job:  [id number]

## 2011-05-14 ENCOUNTER — Telehealth: Payer: Self-pay | Admitting: Pulmonary Disease

## 2011-05-14 NOTE — Telephone Encounter (Signed)
Called, spoke with pt.  States she was supposed to call back when she ran out of bisoprolol so this can be changed to something else.  She would like new med to be on $4 list.  Allergies verified.  [facility] Dr. Kriste Basque, pls advise. Thanks!

## 2011-05-15 NOTE — Telephone Encounter (Signed)
Called and spoke with pt and she stated that SN gave her an rx for the bisoprolol 5mg  to take daily and she stated that this med is on [facility]15 [facility] and she needs something on the $4 list or $10 for a 90 day supply.  Pt stated that enalapril and metoprolol tart is on this list but she is not sure if SN wants to give  Her one of these meds.  Pt stated that they are on a very tight budget and would appreciate a cheaper med.  Pt is taking the lasix 20mg  daily and amlodipine 10mg  daily as well.  Please advise. thanks

## 2011-05-15 NOTE — Telephone Encounter (Signed)
Will call the pharmacy to see what meds that the pt has been filling.

## 2011-05-16 MED ORDER — METOPROLOL TARTRATE 50 MG PO TABS: 50.0000 mg | ORAL_TABLET | Freq: Two times a day (BID) | ORAL | Status: DC

## 2011-05-16 NOTE — Telephone Encounter (Signed)
Per SN--ok to change to metoprolol tart 50mg   1 po bid   #180   1 po bid --please let the pt know that this is a twice a day med and .  Thanks--[facility] to [facility] [address].

## 2011-05-16 NOTE — Telephone Encounter (Signed)
rx sent to [facility]. Pt aware. Carron Curie, CMA

## 2011-05-16 NOTE — Telephone Encounter (Signed)
PATIENT PREFERS [facility] [address] FOR PHARMACY

## 2011-05-16 NOTE — Telephone Encounter (Signed)
LMOM TCB x1.  rx not sent as there are multiple pharmacies listed in message and i would like to verify which the patient prefers.

## 2011-08-07 ENCOUNTER — Ambulatory Visit (HOSPITAL_COMMUNITY)
Admission: RE | Admit: 2011-08-07 | Discharge: 2011-08-07 | Disposition: A | Discharge status: Home / Self Care | Payer: BC Managed Care – PPO | Source: Ambulatory Visit | Attending: Urology | Admitting: Urology

## 2011-08-07 ENCOUNTER — Other Ambulatory Visit (HOSPITAL_COMMUNITY): Payer: Self-pay | Admitting: Urology

## 2011-08-07 DIAGNOSIS — C649 Malignant neoplasm of unspecified kidney, except renal pelvis: Secondary | ICD-10-CM | POA: Insufficient documentation

## 2011-08-07 DIAGNOSIS — Z8051 Family history of malignant neoplasm of kidney: Secondary | ICD-10-CM

## 2011-08-07 DIAGNOSIS — I10 Essential (primary) hypertension: Secondary | ICD-10-CM | POA: Insufficient documentation

## 2012-01-09 ENCOUNTER — Other Ambulatory Visit: Payer: Self-pay | Admitting: Pulmonary Disease

## 2012-01-09 DIAGNOSIS — Z1231 Encounter for screening mammogram for malignant neoplasm of breast: Secondary | ICD-10-CM

## 2012-01-23 ENCOUNTER — Ambulatory Visit
Admission: RE | Admit: 2012-01-23 | Discharge: 2012-01-23 | Disposition: A | Discharge status: Home / Self Care | Payer: BC Managed Care – PPO | Source: Ambulatory Visit | Attending: Pulmonary Disease | Admitting: Pulmonary Disease

## 2012-01-23 DIAGNOSIS — Z1231 Encounter for screening mammogram for malignant neoplasm of breast: Secondary | ICD-10-CM

## 2012-01-29 ENCOUNTER — Encounter (INDEPENDENT_AMBULATORY_CARE_PROVIDER_SITE_OTHER): Payer: BC Managed Care – PPO | Admitting: Obstetrics and Gynecology

## 2012-01-29 DIAGNOSIS — Z01419 Encounter for gynecological examination (general) (routine) without abnormal findings: Secondary | ICD-10-CM

## 2012-01-29 DIAGNOSIS — N3941 Urge incontinence: Secondary | ICD-10-CM

## 2012-02-21 ENCOUNTER — Telehealth: Payer: Self-pay | Admitting: Pulmonary Disease

## 2012-02-21 MED ORDER — SIMVASTATIN 20 MG PO TABS: 20.0000 mg | ORAL_TABLET | Freq: Every evening | ORAL | Status: DC

## 2012-02-21 MED ORDER — METOPROLOL TARTRATE 50 MG PO TABS: 50.0000 mg | ORAL_TABLET | Freq: Two times a day (BID) | ORAL | Status: DC

## 2012-02-21 MED ORDER — LEVOTHYROXINE SODIUM 75 MCG PO TABS: 75.0000 ug | ORAL_TABLET | Freq: Every day | ORAL | Status: DC

## 2012-02-21 MED ORDER — AMLODIPINE BESYLATE 10 MG PO TABS: 10.0000 mg | ORAL_TABLET | Freq: Every day | ORAL | Status: DC

## 2012-02-21 NOTE — Telephone Encounter (Signed)
Spoke with pt and verified meds needing refilled. Rxs were sent to pharm and pt instructed to keep rov for future refills.

## 2012-04-02 ENCOUNTER — Encounter: Payer: Self-pay | Admitting: *Deleted

## 2012-04-02 ENCOUNTER — Ambulatory Visit (INDEPENDENT_AMBULATORY_CARE_PROVIDER_SITE_OTHER): Payer: BC Managed Care – PPO | Admitting: Pulmonary Disease

## 2012-04-02 VITALS — BP 120/84 | HR 50 | Temp 97.0°F | Ht 62.0 in | Wt 166.4 lb

## 2012-04-02 DIAGNOSIS — E042 Nontoxic multinodular goiter: Secondary | ICD-10-CM

## 2012-04-02 DIAGNOSIS — Z0000 Encounter for general adult medical examination without abnormal findings: Secondary | ICD-10-CM

## 2012-04-02 DIAGNOSIS — I872 Venous insufficiency (chronic) (peripheral): Secondary | ICD-10-CM

## 2012-04-02 DIAGNOSIS — K5900 Constipation, unspecified: Secondary | ICD-10-CM | POA: Insufficient documentation

## 2012-04-02 DIAGNOSIS — M1990 Unspecified osteoarthritis, unspecified site: Secondary | ICD-10-CM

## 2012-04-02 DIAGNOSIS — K219 Gastro-esophageal reflux disease without esophagitis: Secondary | ICD-10-CM

## 2012-04-02 DIAGNOSIS — K589 Irritable bowel syndrome without diarrhea: Secondary | ICD-10-CM

## 2012-04-02 DIAGNOSIS — C649 Malignant neoplasm of unspecified kidney, except renal pelvis: Secondary | ICD-10-CM

## 2012-04-02 DIAGNOSIS — E663 Overweight: Secondary | ICD-10-CM

## 2012-04-02 DIAGNOSIS — E7800 Pure hypercholesterolemia, unspecified: Secondary | ICD-10-CM

## 2012-04-02 DIAGNOSIS — I10 Essential (primary) hypertension: Secondary | ICD-10-CM

## 2012-04-02 MED ORDER — LINACLOTIDE 290 MCG PO CAPS: 1.0000 | ORAL_CAPSULE | Freq: Every day | ORAL | Status: DC

## 2012-04-02 MED ORDER — METOPROLOL TARTRATE 50 MG PO TABS: 50.0000 mg | ORAL_TABLET | Freq: Two times a day (BID) | ORAL | Status: DC

## 2012-04-02 MED ORDER — SIMVASTATIN 20 MG PO TABS: 20.0000 mg | ORAL_TABLET | Freq: Every evening | ORAL | Status: DC

## 2012-04-02 MED ORDER — LEVOTHYROXINE SODIUM 75 MCG PO TABS: 75.0000 ug | ORAL_TABLET | Freq: Every day | ORAL | Status: DC

## 2012-04-02 MED ORDER — MELOXICAM 7.5 MG PO TABS: 7.5000 mg | ORAL_TABLET | Freq: Every day | ORAL | Status: DC

## 2012-04-02 MED ORDER — AMLODIPINE BESYLATE 10 MG PO TABS: 10.0000 mg | ORAL_TABLET | Freq: Every day | ORAL | Status: DC

## 2012-04-02 MED ORDER — FUROSEMIDE 20 MG PO TABS: 20.0000 mg | ORAL_TABLET | Freq: Every day | ORAL | Status: DC

## 2012-04-02 NOTE — Progress Notes (Signed)
Subjective:     Patient ID: Savannah Hubbard, female   DOB: [birth year], 56 y.o.   MRN: [id number]  HPI 56 y/o BF here for a follow up visit... she has multiple medical problems as listed below>>  ~  February 07, 2011:  63yr ROV and she's under some stress w/ husb open heart surg but overall stable...    HBP>  BP controlled on Bisoprolol, Amlodipine, & Lasix; BP= 116/82 & denies CP, palpit, SOB, edema...    Chol>  on Simva20 + diet & FLP shows TChol 161, TG 148, HDL 43, LDL 89...    Thyroid>  she has multinod thyroid on Synthroid88 and TSH= 0.18 so we will decr the dose to 45mcg/d.    Renal>  she is s/p left nephrectomy 2005 for renal cell ca;  saw DrGrapey 6/11 for f/u & he reminded her of need for CXR/ Labs;  last CXR= 6/11 clear lungs, no mets;  Labs show BUN 16, Creat 1.2, LFTs norm...  ~  Apr 02, 2012:  7mo ROV & Yasuko feels well overall; recently increased exercise & having some discomfort in her right side, worse w/ movement; exam is neg & she is advised to back off slightly, use heat/ myoflex, etc... She also notes that Pharm didn't change her Synthroid from 32mcg/d to 14mcg/d until her most recent refill last month?!* I indicated she was due for FASTING blood work & we will take this into account & rec continuing the dose for now...  We reviewed her Problems, Meds, XRays, & Labs> see prob list below>> LABS 5/13:  FLP- wnl on Simva20;  Chem- wnl;  CBC- wnl;  TSH=0.85;  UA- clear...   PROBLEM LIST:    << PROBLEM LIST UPDATED 04/02/12 >>     HYPERTENSION (ICD-401.9) - on METOPROLOL 50mg Bid,  AMLODIPNE 10mg /d & LASIX 20mg /d... she is ACE intolerant...  ~  3/12:  BP= 136/88 and she reports 120's/ 70's at home... denies HA, fatigue, visual changes, CP, palipit, dizziness, syncope, dyspnea, edema, etc... since she has only one kidney we discussed better control needed (Bisoprolol changed to Metoprolol)... ~  5/13:  BP= 120/84 & similar at home; she remains asymptomatic & stable on these 3  meds...  VENOUS INSUFFICIENCY (ICD-459.81) - she has mild VI and knows to avoid sodium, elevate legs, wear support hose, & take the Lasix Qam...  HYPERCHOLESTEROLEMIA (ICD-272.0) - on SIMVASTATIN 20mg /d started 8/09... ~  FLP 6/07 showed TChol 187, TG 108, HDL 38, LDL 128... she prefered diet alone. ~  FLP 8/09 showed TChol 187, TG 109, HDL 35, LDL 130... rec to start Simvastatin20. ~  labs at [facility] 1/10 = TChol 153, TG 70, HDL 47, LDL 92... rec> continue Simva20... ~  Labs 3/12 on Simva20 showed TChol 161, TG 148, HDL 43, LDL 89 ~  Labs 5/13 on Simva20 showed TChol 126, TG 84, HDL 45, LDL 64  NONTOXIC MULTINODULAR GOITER (ICD-241.1) - on SYNTHROID 5mcg/d (?decreased from 88 to 75 in 2012 but pharm didn't execute this order til 2013?)... hx palp thyroid on exam w/ eval in 2005 showing inhomogeneous uptake bilat w/ sonar showing at least 6 sm nodules- mixed solid & cystic lesions c/w multinodular gland... Synthroid suppression started and dose adjusted... ~  labs 7/08 showed TSH= 0.16 ~  labs 8/09 showed TSH= 0.27 ~  labs 1/10 at [facility] = TSH= 0.42, T4= 11.1, T3uptake= 29, FTI= 3.2.[name]. rec> continue same dose. ~  Labs 3/12 on Levothy88 showed TSH= 0.18 and  dose decr from 35mcg/d to 73mcg/d... ~  Labs 5/13 on Levothy75 for a few weeks showed TSH=0.85... We decided to continue the 63mcg/d dose...  OVERWEIGHT (ICD-278.02) - we reviewed diet, exercise, wt reduction strategies... ~  Weight 3/12 = 170# ~  Weight 5/13 = 166#  GERD (ICD-530.81) - min reflux symptoms intermittently- OTC Rx as needed.  IRRITABLE BOWEL SYNDROME (ICD-564.1) - she had a neg colonoscopy in 1989 by DrPatterson (referred by [facility] for rectal bleeding at that time)... ~  5/13:  She tells me that she was evaluated by [facility] on referral from her GYN, DrStringer; she has Constipation predom IBS & was in the habit of using a suppos every day to go; DrMann started [employer] 290mg /d & pt reports that this has helped a lot  & she requests that I refill this med for her- ok;  Pt has colonoscopy sched w/ DrMann in 3 weeks- asked to get copy sent to me as we don't have notes from [facility]...  GYN >> she sees DrStringer for her GYN needs; she assures me that she is up to date & doing well...  RENAL CELL CANCER (ICD-189.0) - she had an incidental left renal mass found on CTAbd done as part of a research protocol at [facility] in 2005 (her brother had DM and they were studying the siblings)...  5x6cm left lower pole mass found and removed by DrGrapey 10/05= renal cell cancer... she sees DrGrapey yearly now- last CTAbd in 2007 was OK... CXR & Labs here w/ copy to DrGrapey...  DEGENERATIVE JOINT DISEASE (ICD-715.90) - she has had LBP, left hip pain, and shoulder discomfort... all Rx w/ MOBIC 7.5mg  Prn...  BACK PAIN, LUMBAR (ICD-724.2)  Health Maintenance - her GYN is DrStringer and she is s/p hysterectomy... she gets her Mammograms at the [facility] and is up-to-date...   Past Surgical History  Procedure Date  . Abdominal hysterectomy   . Left nephrectomy for renal cell cancer 2005    Dr. Isabel Caprice    Outpatient Encounter Prescriptions as of 04/02/2012  Medication Sig Dispense Refill  . amLODipine (NORVASC) 10 MG tablet Take 1 tablet (10 mg total) by mouth daily.  30 tablet  1  . furosemide (LASIX) 20 MG tablet Take 20 mg by mouth daily.      [name] levothyroxine (SYNTHROID) 75 MCG tablet Take 1 tablet (75 mcg total) by mouth daily.  30 tablet  1  . Linaclotide (LINZESS) 290 MCG CAPS Take 1 capsule by mouth daily.      . meloxicam (MOBIC) 7.5 MG tablet Take 7.5 mg by mouth daily.      . metoprolol (LOPRESSOR) 50 MG tablet Take 1 tablet (50 mg total) by mouth 2 (two) times daily.  30 tablet  1  . simvastatin (ZOCOR) 20 MG tablet Take 1 tablet (20 mg total) by mouth every evening.  30 tablet  1    Allergies  Allergen Reactions  . Lisinopril     angioedema  . Morphine     REACTION: ITCHING  . Sulfonamide Derivatives       REACTION: BURNING, IRRITATION    Current Medications, Allergies, Past Medical History, Past Surgical History, Family History, and Social History were reviewed in [employer] record.   Review of Systems    See HPI - all other systems neg except as noted... The patient denies anorexia, fever, weight loss, weight gain, vision loss, decreased hearing, hoarseness, chest pain, syncope, dyspnea on exertion, peripheral edema, prolonged cough, headaches, hemoptysis,  abdominal pain, melena, hematochezia, severe indigestion/heartburn, hematuria, incontinence, muscle weakness, suspicious skin lesions, transient blindness, difficulty walking, depression, unusual weight change, abnormal bleeding, enlarged lymph nodes, and angioedema.     Objective:   Physical Exam      WD, WN, 55 y/o BF in NAD... GENERAL:  Alert & oriented; pleasant & cooperative... HEENT:  [place]/AT, EOM-wnl, PERRLA, Glasses, EACs-clear, TMs-wnl, NOSE-clear, THROAT-clear & wnl. NECK:  Supple w/ full ROM; no JVD; normal carotid impulses w/o bruits; palp thyroid w/ nodular feel; no lymphadenopathy. CHEST:  Clear to P & A; without wheezes/ rales/ or rhonchi heard... HEART:  Regular Rhythm; without murmurs/ rubs/ or gallops detected... ABDOMEN:  Soft & nontender; normal bowel sounds; no organomegaly or masses palpated... EXT: without deformities or arthritic changes; no varicose veins/ venous insuffic/ or edema. NEURO:  CN's intact; motor testing normal; sensory testing normal; gait normal & balance OK. DERM:   fine macular/papular rash along arm, torso and legs. no vesicles or excoriations noted.  RADIOLOGY DATA:  Reviewed in the EPIC EMR & discussed w/ the patient...  LABORATORY DATA:  Reviewed in the EPIC EMR & discussed w/ the patient...   Assessment:     CPX>>  HBP>  Controlled on her 3 meds; she is intol to ACE rx; continue same + diet & exercise, get wt down...  Ven Insuffic>  Aware, she knows to  elim sodium, elev legs, wear support hose, & take the Lasix...  CHOL>  Stable on Simva20 + diet; continue same...  Multinod Thyroid>  Somehow pharm just recently decr the Synthroid to 37mcg/d; continue this dose & we will follow...  Overweight>  Weight is down 4# to 166# & we reviewed diet/ exercise/ wt reduction strategies...  GI> GERD, IBS w/ Constip>  She likes the [facility] 290mg  started by Dr<Mann & she asks [place] to refill this Rx for her- ok...  Hx Renal Cell Ca w/ left nephrectomy 2005>  Followed yearly by DrGrapey & she continues stable, cancer free...  Mild DJD, LBP>  Aware, she uses OTC analgesics as needed; we discussed exercise program...     Plan:     Patient's Medications  New Prescriptions   No medications on file  Previous Medications   No medications on file  Modified Medications   Modified Medication Previous Medication   AMLODIPINE (NORVASC) 10 MG TABLET amLODipine (NORVASC) 10 MG tablet      Take 1 tablet (10 mg total) by mouth daily.    Take 1 tablet (10 mg total) by mouth daily.   FUROSEMIDE (LASIX) 20 MG TABLET furosemide (LASIX) 20 MG tablet      Take 1 tablet (20 mg total) by mouth daily.    Take 20 mg by mouth daily.   LEVOTHYROXINE (SYNTHROID) 75 MCG TABLET levothyroxine (SYNTHROID) 75 MCG tablet      Take 1 tablet (75 mcg total) by mouth daily.    Take 1 tablet (75 mcg total) by mouth daily.   LINACLOTIDE (LINZESS) 290 MCG CAPS Linaclotide (LINZESS) 290 MCG CAPS      Take 1 capsule by mouth daily.    Take 1 capsule by mouth daily.   MELOXICAM (MOBIC) 7.5 MG TABLET meloxicam (MOBIC) 7.5 MG tablet      Take 1 tablet (7.5 mg total) by mouth daily.    Take 7.5 mg by mouth daily.   METOPROLOL (LOPRESSOR) 50 MG TABLET metoprolol (LOPRESSOR) 50 MG tablet      Take 1 tablet (50 mg total) by mouth 2 (two)  times daily.    Take 1 tablet (50 mg total) by mouth 2 (two) times daily.   SIMVASTATIN (ZOCOR) 20 MG TABLET simvastatin (ZOCOR) 20 MG tablet      Take 1 tablet  (20 mg total) by mouth every evening.    Take 1 tablet (20 mg total) by mouth every evening.  Discontinued Medications   No medications on file

## 2012-04-02 NOTE — Patient Instructions (Signed)
Today we updated your med list in our EPIC system...    Continue your current medications the same...  We refilled your meds per request...  Please return to our lab one morning this week for your FASTING blood work...    We will call you w/ the results when avail...  Keep up the good work w/ diet & exercise!!!  Call for any questions.Marland KitchenMarland Kitchen

## 2012-04-04 ENCOUNTER — Other Ambulatory Visit (INDEPENDENT_AMBULATORY_CARE_PROVIDER_SITE_OTHER): Payer: BC Managed Care – PPO

## 2012-04-04 DIAGNOSIS — Z0000 Encounter for general adult medical examination without abnormal findings: Secondary | ICD-10-CM

## 2012-04-04 LAB — CBC WITH DIFFERENTIAL/PLATELET
Basophils Relative: 0.7 % (ref 0.0–3.0)
Eosinophils Absolute: 0.2 10*3/uL (ref 0.0–0.7)
Eosinophils Relative: 3.3 % (ref 0.0–5.0)
Hemoglobin: 13.8 g/dL (ref 12.0–15.0)
Lymphocytes Relative: 29.6 % (ref 12.0–46.0)
MCHC: 33.2 g/dL (ref 30.0–36.0)
MCV: 94.9 fl (ref 78.0–100.0)
Monocytes Absolute: 0.5 10*3/uL (ref 0.1–1.0)
Neutro Abs: 4.1 10*3/uL (ref 1.4–7.7)
Neutrophils Relative %: 59.6 % (ref 43.0–77.0)
RBC: 4.37 Mil/uL (ref 3.87–5.11)
WBC: 6.9 10*3/uL (ref 4.5–10.5)

## 2012-04-04 LAB — URINALYSIS
Nitrite: NEGATIVE
Total Protein, Urine: NEGATIVE
Urine Glucose: NEGATIVE
pH: 7 (ref 5.0–8.0)

## 2012-04-04 LAB — BASIC METABOLIC PANEL
BUN: 12 mg/dL (ref 6–23)
Calcium: 9.3 mg/dL (ref 8.4–10.5)
Creatinine, Ser: 1 mg/dL (ref 0.4–1.2)

## 2012-04-04 LAB — LIPID PANEL
Cholesterol: 126 mg/dL (ref 0–200)
HDL: 45.4 mg/dL (ref >39.00)
LDL Cholesterol: 64 mg/dL (ref 0–99)
Total CHOL/HDL Ratio: 3
Triglycerides: 84 mg/dL (ref 0.0–149.0)
VLDL: 16.8 mg/dL (ref 0.0–40.0)

## 2012-04-04 LAB — HEPATIC FUNCTION PANEL
ALT: 16 U/L (ref 0–35)
Bilirubin, Direct: 0.1 mg/dL (ref 0.0–0.3)
Total Bilirubin: 0.5 mg/dL (ref 0.3–1.2)

## 2012-10-03 ENCOUNTER — Other Ambulatory Visit (HOSPITAL_COMMUNITY): Payer: Self-pay | Admitting: Urology

## 2012-10-03 ENCOUNTER — Ambulatory Visit (HOSPITAL_COMMUNITY)
Admission: RE | Admit: 2012-10-03 | Discharge: 2012-10-03 | Disposition: A | Discharge status: Home / Self Care | Payer: BC Managed Care – PPO | Source: Ambulatory Visit | Attending: Urology | Admitting: Urology

## 2012-10-03 DIAGNOSIS — Z85528 Personal history of other malignant neoplasm of kidney: Secondary | ICD-10-CM

## 2012-10-03 DIAGNOSIS — C649 Malignant neoplasm of unspecified kidney, except renal pelvis: Secondary | ICD-10-CM | POA: Insufficient documentation

## 2012-10-28 ENCOUNTER — Ambulatory Visit: Payer: BC Managed Care – PPO | Admitting: Adult Health

## 2012-10-29 ENCOUNTER — Encounter: Payer: Self-pay | Admitting: Adult Health

## 2012-10-29 ENCOUNTER — Ambulatory Visit (INDEPENDENT_AMBULATORY_CARE_PROVIDER_SITE_OTHER): Payer: BC Managed Care – PPO | Admitting: Adult Health

## 2012-10-29 VITALS — BP 128/66 | HR 50 | Temp 98.5°F | Ht 62.0 in | Wt 165.8 lb

## 2012-10-29 DIAGNOSIS — M545 Low back pain, unspecified: Secondary | ICD-10-CM

## 2012-10-29 MED ORDER — METAXALONE 800 MG PO TABS: 800.0000 mg | ORAL_TABLET | Freq: Three times a day (TID) | ORAL | Status: DC | PRN

## 2012-10-29 MED ORDER — MELOXICAM 7.5 MG PO TABS: 7.5000 mg | ORAL_TABLET | Freq: Every day | ORAL | Status: DC

## 2012-10-29 NOTE — Progress Notes (Signed)
Subjective:     Patient ID: Savannah Hubbard, female   DOB: 03-04-1956, 56 y.o.   MRN: [id number]  HPI  56 y/o BF  multiple medical problems as listed below>>  ~  February 07, 2011:  41yr ROV and she's under some stress w/ husb open heart surg but overall stable...    HBP>  BP controlled on Bisoprolol, Amlodipine, & Lasix; BP= 116/82 & denies CP, palpit, SOB, edema...    Chol>  on Simva20 + diet & FLP shows TChol 161, TG 148, HDL 43, LDL 89...    Thyroid>  she has multinod thyroid on Synthroid88 and TSH= 0.18 so we will decr the dose to 59mcg/d.    Renal>  she is s/p left nephrectomy 2005 for renal cell ca;  saw DrGrapey 6/11 for f/u & he reminded her of need for CXR/ Labs;  last CXR= 6/11 clear lungs, no mets;  Labs show BUN 16, Creat 1.2, LFTs norm...  ~  Apr 02, 2012:  30mo ROV & Savannah Hubbard feels well overall; recently increased exercise & having some discomfort in her right side, worse w/ movement; exam is neg & she is advised to back off slightly, use heat/ myoflex, etc... She also notes that Pharm didn't change her Synthroid from 34mcg/d to 86mcg/d until her most recent refill last month?!* I indicated she was due for FASTING blood work & we will take this into account & rec continuing the dose for now...  We reviewed her Problems, Meds, XRays, & Labs> see prob list below>> LABS 5/13:  FLP- wnl on Simva20;  Chem- wnl;  CBC- wnl;  TSH=0.85;  UA- clear...  10/29/2012 Acute OV  Complains of right flank pain described as nagging and dull, "ball of pain like a Savannah Hubbard" that moves from the back toward the navel x2 months.  denies urinnary symptoms or rash/blisters. Feels she gets a catch in her back at times with muscle spasm that radiates from right low back around to her side/abdomen. This has been going on for several months.  No rash, no known injury, urinary symptoms.  Has not taken any meds for tx.  Sitting prolonged periods seems to aggravate.   Recently seen by Dr. Isabel Caprice w/ labs,  urine and chest xray  , says they were all fine. Hx of renal cell carcinoma on left s/p nephrectomy  No records available at todays visit.     PROBLEM LIST:    << PROBLEM LIST UPDATED 04/02/12 >>     HYPERTENSION (ICD-401.9) - on METOPROLOL 50mg Bid,  AMLODIPNE 10mg /d & LASIX 20mg /d... she is ACE intolerant...  ~  3/12:  BP= 136/88 and she reports 120's/ 70's at home... denies HA, fatigue, visual changes, CP, palipit, dizziness, syncope, dyspnea, edema, etc... since she has only one kidney we discussed better control needed (Bisoprolol changed to Metoprolol)... ~  5/13:  BP= 120/84 & similar at home; she remains asymptomatic & stable on these 3 meds...  VENOUS INSUFFICIENCY (ICD-459.81) - she has mild VI and knows to avoid sodium, elevate legs, wear support hose, & take the Lasix Qam...  HYPERCHOLESTEROLEMIA (ICD-272.0) - on SIMVASTATIN 20mg /d started 8/09... ~  FLP 6/07 showed TChol 187, TG 108, HDL 38, LDL 128... she prefered diet alone. ~  FLP 8/09 showed TChol 187, TG 109, HDL 35, LDL 130... rec to start Simvastatin20. ~  labs at [facility] 1/10 = TChol 153, TG 70, HDL 47, LDL 92... rec> continue Simva20... ~  Labs 3/12 on Simva20 showed TChol  161, TG 148, HDL 43, LDL 89 ~  Labs 5/13 on Simva20 showed TChol 126, TG 84, HDL 45, LDL 64  NONTOXIC MULTINODULAR GOITER (ICD-241.1) - on SYNTHROID 18mcg/d (?decreased from 88 to 75 in 2012 but pharm didn't execute this order til 2013?)... hx palp thyroid on exam w/ eval in 2005 showing inhomogeneous uptake bilat w/ sonar showing at least 6 sm nodules- mixed solid & cystic lesions c/w multinodular gland... Synthroid suppression started and dose adjusted... ~  labs 7/08 showed TSH= 0.16 ~  labs 8/09 showed TSH= 0.27 ~  labs 1/10 at [facility] = TSH= 0.42, T4= 11.1, T3uptake= 29, FTI= 3.2.[name]. rec> continue same dose. ~  Labs 3/12 on Levothy88 showed TSH= 0.18 and dose decr from 50mcg/d to 97mcg/d... ~  Labs 5/13 on Levothy75 for a few weeks showed TSH=0.85...  We decided to continue the 91mcg/d dose...  OVERWEIGHT (ICD-278.02) - we reviewed diet, exercise, wt reduction strategies... ~  Weight 3/12 = 170# ~  Weight 5/13 = 166#  GERD (ICD-530.81) - min reflux symptoms intermittently- OTC Rx as needed.  IRRITABLE BOWEL SYNDROME (ICD-564.1) - she had a neg colonoscopy in 1989 by DrPatterson (referred by [facility] for rectal bleeding at that time)... ~  5/13:  She tells me that she was evaluated by [facility] on referral from her GYN, DrStringer; she has Constipation predom IBS & was in the habit of using a suppos every day to go; DrMann started [employer] 290mg /d & pt reports that this has helped a lot & she requests that I refill this med for her- ok;  Pt has colonoscopy sched w/ DrMann in 3 weeks- asked to get copy sent to me as we don't have notes from [facility]...  GYN >> she sees DrStringer for her GYN needs; she assures me that she is up to date & doing well...  RENAL CELL CANCER (ICD-189.0) - she had an incidental left renal mass found on CTAbd done as part of a research protocol at [facility] in 2005 (her brother had DM and they were studying the siblings)...  5x6cm left lower pole mass found and removed by DrGrapey 10/05= renal cell cancer... she sees DrGrapey yearly now- last CTAbd in 2007 was OK... CXR & Labs here w/ copy to DrGrapey...  DEGENERATIVE JOINT DISEASE (ICD-715.90) - she has had LBP, left hip pain, and shoulder discomfort... all Rx w/ MOBIC 7.5mg  Prn...  BACK PAIN, LUMBAR (ICD-724.2)  Health Maintenance - her GYN is DrStringer and she is s/p hysterectomy... she gets her Mammograms at the [facility] and is up-to-date...   Past Surgical History  Procedure Date  . Abdominal hysterectomy   . Left nephrectomy for renal cell cancer 2005    Dr. Isabel Caprice    Outpatient Encounter Prescriptions as of 10/29/2012  Medication Sig Dispense Refill  . amLODipine (NORVASC) 10 MG tablet Take 1 tablet (10 mg total) by mouth daily.  90 tablet  3   . furosemide (LASIX) 20 MG tablet Take 1 tablet (20 mg total) by mouth daily.  90 tablet  3  . levothyroxine (SYNTHROID) 75 MCG tablet Take 1 tablet (75 mcg total) by mouth daily.  90 tablet  3  . Linaclotide (LINZESS) 290 MCG CAPS Take 1 capsule by mouth daily.  90 capsule  3  . meloxicam (MOBIC) 7.5 MG tablet Take 1 tablet (7.5 mg total) by mouth daily.  90 tablet  3  . metoprolol (LOPRESSOR) 50 MG tablet Take 1 tablet (50 mg total) by mouth 2 (two) times  daily.  180 tablet  3  . simvastatin (ZOCOR) 20 MG tablet Take 1 tablet (20 mg total) by mouth every evening.  90 tablet  3  . TRAVATAN Z 0.004 % SOLN ophthalmic solution Place 1 drop into both eyes At bedtime.        Allergies  Allergen Reactions  . Lisinopril     angioedema  . Morphine     REACTION: ITCHING  . Sulfonamide Derivatives     REACTION: BURNING, IRRITATION    Current Medications, Allergies, Past Medical History, Past Surgical History, Family History, and Social History were reviewed in [employer] record.   Review of Systems     Constitutional:   No  weight loss, night sweats,  Fevers, chills, fatigue, or  lassitude.  HEENT:   No headaches,  Difficulty swallowing,  Tooth/dental problems, or  Sore throat,                No sneezing, itching, ear ache, nasal congestion, post nasal drip,   CV:  No chest pain,  Orthopnea, PND, swelling in lower extremities, anasarca, dizziness, palpitations, syncope.   GI  No heartburn, indigestion, abdominal pain, nausea, vomiting, diarrhea, change in bowel habits, loss of appetite, bloody stools.   Resp: No shortness of breath with exertion or at rest.  No excess mucus, no productive cough,  No non-productive cough,  No coughing up of blood.  No change in color of mucus.  No wheezing.  No chest wall deformity  Skin: no rash or lesions.  GU: no dysuria, change in color of urine, no urgency or frequency.  No , no hematuria   MS:  No joint  swelling.      [name]  Psych:  No change in mood or affect. No depression or anxiety.  No memory loss.      Objective:   Physical Exam       WD, WN, 55 y/o BF in NAD... GENERAL:  Alert & oriented; pleasant & cooperative... HEENT:  [place]/AT,  EACs-clear, TMs-wnl, NOSE-clear, THROAT-clear & wnl. NECK:  Supple w/ full ROM; no JVD; normal carotid impulses w/o bruits; palp thyroid w/ nodular feel; no lymphadenopathy. CHEST:  Clear to P & A; without wheezes/ rales/ or rhonchi heard... HEART:  Regular Rhythm; without murmurs/ rubs/ or gallops detected... ABDOMEN:  Soft & nontender; normal bowel sounds; no organomegaly or masses palpated... EXT: without deformities or arthritic changes; no varicose veins/ venous insuffic/ or edema. Mild tenderness along right lumbar region, no deformity noted. Neg SLR, nml strength of UE/LE ,  nml gait.  NEURO:  CN's intact; motor testing normal; sensory testing normal; gait normal & balance OK.  DERM: no rash   Assessment:

## 2012-10-29 NOTE — Patient Instructions (Addendum)
May use Mobic 7.5mg  daily for 1 week then As needed  For back pain  Alternate ice and heat to back .  Skelaxin 800mg  Three times a day  As needed  Muscle spasm.  Advance activity and stretching As needed   Please contact office for sooner follow up if symptoms do not improve or worsen or seek emergency care  If this is not improving by 3 weeks will need further testing.

## 2012-10-30 NOTE — Assessment & Plan Note (Signed)
Back pain/strain  If not imrproving will need further evaluation w/ possible xray and urine test.   Plan May use Mobic 7.5mg  daily for 1 week then As needed  For back pain  Alternate ice and heat to back .  Skelaxin 800mg  Three times a day  As needed  Muscle spasm.  Advance activity and stretching As needed   Please contact office for sooner follow up if symptoms do not improve or worsen or seek emergency care  If this is not improving by 3 weeks will need further testing.

## 2013-02-13 ENCOUNTER — Other Ambulatory Visit: Payer: Self-pay | Admitting: *Deleted

## 2013-02-13 MED ORDER — LINACLOTIDE 290 MCG PO CAPS: 1.0000 | ORAL_CAPSULE | Freq: Every day | ORAL | Status: DC

## 2013-04-03 ENCOUNTER — Telehealth: Payer: Self-pay | Admitting: Pulmonary Disease

## 2013-04-03 MED ORDER — FUROSEMIDE 20 MG PO TABS: 20.0000 mg | ORAL_TABLET | Freq: Every day | ORAL | Status: DC

## 2013-04-03 NOTE — Telephone Encounter (Signed)
Spoke with pt  Called in her lasix.pt has ov on 05-19-13 Nothing further needed.

## 2013-05-19 ENCOUNTER — Ambulatory Visit (INDEPENDENT_AMBULATORY_CARE_PROVIDER_SITE_OTHER): Payer: BC Managed Care – PPO | Admitting: Pulmonary Disease

## 2013-05-19 ENCOUNTER — Encounter: Payer: Self-pay | Admitting: Pulmonary Disease

## 2013-05-19 VITALS — BP 118/72 | HR 53 | Temp 98.3°F | Ht 62.0 in | Wt 172.8 lb

## 2013-05-19 DIAGNOSIS — E7800 Pure hypercholesterolemia, unspecified: Secondary | ICD-10-CM

## 2013-05-19 DIAGNOSIS — M545 Low back pain, unspecified: Secondary | ICD-10-CM

## 2013-05-19 DIAGNOSIS — E663 Overweight: Secondary | ICD-10-CM

## 2013-05-19 DIAGNOSIS — I10 Essential (primary) hypertension: Secondary | ICD-10-CM

## 2013-05-19 DIAGNOSIS — E042 Nontoxic multinodular goiter: Secondary | ICD-10-CM

## 2013-05-19 DIAGNOSIS — C649 Malignant neoplasm of unspecified kidney, except renal pelvis: Secondary | ICD-10-CM | POA: Insufficient documentation

## 2013-05-19 DIAGNOSIS — I872 Venous insufficiency (chronic) (peripheral): Secondary | ICD-10-CM

## 2013-05-19 DIAGNOSIS — K589 Irritable bowel syndrome without diarrhea: Secondary | ICD-10-CM

## 2013-05-19 DIAGNOSIS — C642 Malignant neoplasm of left kidney, except renal pelvis: Secondary | ICD-10-CM

## 2013-05-19 DIAGNOSIS — M1990 Unspecified osteoarthritis, unspecified site: Secondary | ICD-10-CM

## 2013-05-19 DIAGNOSIS — K219 Gastro-esophageal reflux disease without esophagitis: Secondary | ICD-10-CM

## 2013-05-19 MED ORDER — FUROSEMIDE 20 MG PO TABS: 20.0000 mg | ORAL_TABLET | Freq: Every day | ORAL | Status: DC

## 2013-05-19 MED ORDER — LINACLOTIDE 290 MCG PO CAPS: 1.0000 | ORAL_CAPSULE | Freq: Every day | ORAL | Status: DC

## 2013-05-19 MED ORDER — METOPROLOL TARTRATE 50 MG PO TABS: 50.0000 mg | ORAL_TABLET | Freq: Two times a day (BID) | ORAL | Status: DC

## 2013-05-19 MED ORDER — LEVOTHYROXINE SODIUM 75 MCG PO TABS: 75.0000 ug | ORAL_TABLET | Freq: Every day | ORAL | Status: DC

## 2013-05-19 MED ORDER — AMLODIPINE BESYLATE 10 MG PO TABS: 10.0000 mg | ORAL_TABLET | Freq: Every day | ORAL | Status: DC

## 2013-05-19 MED ORDER — SIMVASTATIN 20 MG PO TABS: 20.0000 mg | ORAL_TABLET | Freq: Every evening | ORAL | Status: DC

## 2013-05-19 NOTE — Patient Instructions (Addendum)
Today we updated your med list in our EPIC system...    Continue your current medications the same...    We refilled your meds per request...  Please return to our lab one morning this week for your FASTING blood work...    We will contact you w/ the results when available...   Keep up the good work w/ diet & exercise> it is the only true path to weight reduction...  Call for any questions...  Let's plan a follow up visit in 69yr, sooner if needed for problems.Marland KitchenMarland Kitchen

## 2013-05-19 NOTE — Progress Notes (Addendum)
Subjective:     Patient ID: Savannah Hubbard, female   DOB: Apr 14, 1956, 57 y.o.   MRN: [id number]  HPI 57 y/o BF here for a follow up visit... she has multiple medical problems as listed below>>  ~  February 07, 2011:  84yr ROV and she's under some stress w/ husb open heart surg but overall stable...    HBP>  BP controlled on Bisoprolol, Amlodipine, & Lasix; BP= 116/82 & denies CP, palpit, SOB, edema...    Chol>  on Simva20 + diet & FLP shows TChol 161, TG 148, HDL 43, LDL 89...    Thyroid>  she has multinod thyroid on Synthroid88 and TSH= 0.18 so we will decr the dose to 70mcg/d.    Renal>  she is s/p left nephrectomy 2005 for renal cell ca;  saw DrGrapey 6/11 for f/u & he reminded her of need for CXR/ Labs;  last CXR= 6/11 clear lungs, no mets;  Labs show BUN 16, Creat 1.2, LFTs norm...  ~  Apr 02, 2012:  45mo ROV & Savannah Hubbard feels well overall; recently increased exercise & having some discomfort in her right side, worse w/ movement; exam is neg & she is advised to back off slightly, use heat/ myoflex, etc... She also notes that Pharm didn't change her Synthroid from 2mcg/d to 79mcg/d until her most recent refill last month?!* I indicated she was due for FASTING blood work & we will take this into account & rec continuing the dose for now...  We reviewed her Problems, Meds, XRays, & Labs> see prob list below>> LABS 5/13:  FLP- wnl on Simva20;  Chem- wnl;  CBC- wnl;  TSH=0.85;  UA- clear...  ~  May 19, 2013:  82mo ROV & Savannah Hubbard is upset about her weight (difficulty losing weight), notes thinning hair, & fatigue- she wonders about her thyroid (currently on Synthroid75 w/ last TSH=0.85); follow up fasting blood work is pending & she is reassured that we will recheck her definitive TFT w/ this blood work;  We reviewed the following medical problems during today's office visit >>     Glaucoma> on 3 drops per Ophthalmology...    HBP> on Metoprolol50Bid, Amlod10, Lasix20; BP= 118/72 & she denies HA,  CP, palpit, SOB, edema, etc...    Ven Insuffic> she knows to elim sodium, elev legs, wear support hose, on Lasix20...    CHOL> on Simva20; last FLP 5/13 showed TChol 126, TG 84, HDL 45, LDL 64; she will ret for fasting labs=> TChol 198, TG 83, HDL 60, LDL 121 (rec- better diet, same med for now).    Multinod Thyroid> on Synthroid75; she is c/o fatigue, thinning hair, wt gain; Labs 5/13 showed TSH= 0.85; she will ret for fasting blood work=> TSH=4.07 (OK to incr back to 77mcg/d).    Overweight> weight= 173# which is up 7# in the last yr; we reviewed diet, exercise, wt reduction strategies...    GI- GERD, IBS, Constip, Hems> on Linzess290; followed by [facility] for GI, she had colonoscopy 5/13 showing hems only...    Hx RenalCellCa> followed by DrGrapey- s/p left radical nephrectomy 2005, last seen 11/13- stable w/o signs of recurrence or mets; Creat stable at 1.1    DJD, LBP> she saw TP 12/13 w/ right back pain- treated w/ Mobic, Skelaxin and improved...  We reviewed prob list, meds, xrays and labs> see below for updates >>  LABS 6/14:  Pending => FLP- fair w/ incr LDL to 121 on same meds;  Chems- ok  x BS=108;  CBC-wnl;  TSH=4.07;  UA- clear... Due to her symptoms and complaints- rec to pt to incr Synthroid back to 50mcg/d dose & recheck TSH in 3 mo...           PROBLEM LIST:       HYPERTENSION (ICD-401.9) >>  ~  on METOPROLOL 50mg Bid,  AMLODIPNE 10mg /d & LASIX 20mg /d... she is ACE intolerant...  ~  3/12:  BP= 136/88 and she reports 120's/ 70's at home... denies HA, fatigue, visual changes, CP, palipit, dizziness, syncope, dyspnea, edema, etc... since she has only one kidney we discussed better control needed (Bisoprolol changed to Metoprolol)... ~  5/13:  BP= 120/84 & similar at home; she remains asymptomatic & stable on these 3 meds... ~  CXR 11/13 showed norm heart size, clear lungs, NAD...  ~  6/14: on Metoprolol50Bid, Amlod10, Lasix20; BP= 118/72 & she denies HA, CP, palpit, SOB, edema,  etc...  VENOUS INSUFFICIENCY (ICD-459.81) - she has mild VI and knows to avoid sodium, elevate legs, wear support hose, & take the Lasix Qam...  HYPERCHOLESTEROLEMIA (ICD-272.0) - on SIMVASTATIN 20mg /d started 8/09... ~  FLP 6/07 showed TChol 187, TG 108, HDL 38, LDL 128... she prefered diet alone. ~  FLP 8/09 showed TChol 187, TG 109, HDL 35, LDL 130... rec to start Simvastatin20. ~  labs at [facility] 1/10 = TChol 153, TG 70, HDL 47, LDL 92... rec> continue Simva20... ~  Labs 3/12 on Simva20 showed TChol 161, TG 148, HDL 43, LDL 89 ~  Labs 5/13 on Simva20 showed TChol 126, TG 84, HDL 45, LDL 64 ~  Labs 6/14 on Simva20 showed TChol 198, TG 83, HDL 60, LDL 121 (what happened? rec same med, better diet, get wt down).  NONTOXIC MULTINODULAR GOITER (ICD-241.1) - on SYNTHROID 106mcg/d (?decreased from 88 to 75 in 2012 but pharm didn't execute this order til 2013?)... hx palp thyroid on exam w/ eval in 2005 showing inhomogeneous uptake bilat w/ sonar showing at least 6 sm nodules- mixed solid & cystic lesions c/w multinodular gland... Synthroid suppression started and dose adjusted... ~  labs 7/08 showed TSH= 0.16 ~  labs 8/09 showed TSH= 0.27 ~  labs 1/10 at [facility] = TSH= 0.42, T4= 11.1, T3uptake= 29, FTI= 3.2.[name]. rec> continue same dose. ~  Labs 3/12 on Levothy88 showed TSH= 0.18 and dose decr from 80mcg/d to 90mcg/d... ~  Labs 5/13 on Levothy75 for a few weeks showed TSH=0.85... We decided to continue the 80mcg/d dose... ~  6/14: she is c/o fatigue, thinning hair, wt gain; Labs on Synthroid75 showed TSH= 4.07 & she is rec to incr Synthroid back to 9mcg/d...  OVERWEIGHT (ICD-278.02) - we reviewed diet, exercise, wt reduction strategies... ~  Weight 3/12 = 170# ~  Weight 5/13 = 166# ~  Weight 6/14 = 173#  GERD (ICD-530.81) - min reflux symptoms intermittently- OTC Rx as needed.  IRRITABLE BOWEL SYNDROME & CONSTIPATION >> she had a neg colonoscopy in 1989 by DrPatterson (referred by [facility] for  rectal bleeding at that time)... ~  5/13:  She tells me that she was evaluated by [facility] on referral from her GYN, DrStringer; she has Constipation predom IBS & was in the habit of using a suppos every day to go; DrMann started [employer] 290mg /d & pt reports that this has helped a lot & she requests that I refill this med for her- ok... ~  Colonoscopy 5/13 by [facility] showed just large int hems- otherw neg...   GYN >> she sees  DrStringer for her GYN needs; she assures me that she is up to date & doing well...  RENAL CELL CANCER (ICD-189.0) - she had an incidental left renal mass found on CTAbd done as part of a research protocol at [facility] in [facility] (her brother had DM and they were studying the siblings)...  5x6cm left lower pole mass found and removed by DrGrapey 10/05= renal cell cancer... she sees DrGrapey yearly now- last CTAbd in 2007 was OK... CXR & Labs here w/ copy to DrGrapey... ~  11/13: she had yearly f/u eval DrGrapey> s/p left radical nephrectomy 2005, no signs of recurrence or mets...   DEGENERATIVE JOINT DISEASE (ICD-715.90) - she has had LBP, left hip pain, and shoulder discomfort... all Rx w/ MOBIC 7.5mg  Prn... ~  12/13: she saw TP w/ right side pain> feels like a catch, Rx w/ Mobic, heat, Skelaxin...   BACK PAIN, LUMBAR (ICD-724.2)  Health Maintenance - her GYN is DrStringer and she is s/p hysterectomy... she gets her Mammograms at the [facility] and is up-to-date...   Past Surgical History  Procedure Laterality Date  . Abdominal hysterectomy    . Left nephrectomy for renal cell cancer  2005    Dr. Isabel Caprice    Outpatient Encounter Prescriptions as of 05/19/2013  Medication Sig Dispense Refill  . amLODipine (NORVASC) 10 MG tablet Take 1 tablet (10 mg total) by mouth daily.  90 tablet  3  . brimonidine (ALPHAGAN P) 0.1 % SOLN Place 1 drop into both eyes 2 (two) times daily.      . dorzolamide-timolol (COSOPT) 22.3-6.8 MG/ML ophthalmic solution Place 1 drop into both  eyes 2 (two) times daily.      . furosemide (LASIX) 20 MG tablet Take 1 tablet (20 mg total) by mouth daily.  90 tablet  3  . levothyroxine (SYNTHROID) 75 MCG tablet Take 1 tablet (75 mcg total) by mouth daily.  90 tablet  3  . Linaclotide (LINZESS) 290 MCG CAPS Take 1 capsule by mouth daily.  90 capsule  1  . metoprolol (LOPRESSOR) 50 MG tablet Take 1 tablet (50 mg total) by mouth 2 (two) times daily.  180 tablet  3  . simvastatin (ZOCOR) 20 MG tablet Take 1 tablet (20 mg total) by mouth every evening.  90 tablet  3  . TRAVATAN Z 0.004 % SOLN ophthalmic solution Place 1 drop into both eyes At bedtime.      . meloxicam (MOBIC) 7.5 MG tablet Take 1 tablet (7.5 mg total) by mouth daily.  90 tablet  3  . metaxalone (SKELAXIN) 800 MG tablet Take 1 tablet (800 mg total) by mouth 3 (three) times daily as needed for pain.  30 tablet  0   No facility-administered encounter medications on file as of 05/19/2013.    Allergies  Allergen Reactions  . Lisinopril     angioedema  . Morphine     REACTION: ITCHING  . Sulfonamide Derivatives     REACTION: BURNING, IRRITATION    Current Medications, Allergies, Past Medical History, Past Surgical History, Family History, and Social History were reviewed in [employer] record.   Review of Systems     See HPI - all other systems neg except as noted... The patient denies anorexia, fever, weight loss, weight gain, vision loss, decreased hearing, hoarseness, chest pain, syncope, dyspnea on exertion, peripheral edema, prolonged cough, headaches, hemoptysis, abdominal pain, melena, hematochezia, severe indigestion/heartburn, hematuria, incontinence, muscle weakness, suspicious skin lesions, transient blindness, difficulty walking, depression,  unusual weight change, abnormal bleeding, enlarged lymph nodes, and angioedema.     Objective:   Physical Exam      WD, WN, 57 y/o BF in NAD... GENERAL:  Alert & oriented; pleasant &  cooperative... HEENT:  [place]/AT, EOM-wnl, PERRLA, Glasses, EACs-clear, TMs-wnl, NOSE-clear, THROAT-clear & wnl. NECK:  Supple w/ full ROM; no JVD; normal carotid impulses w/o bruits; palp thyroid w/ nodular feel; no lymphadenopathy. CHEST:  Clear to P & A; without wheezes/ rales/ or rhonchi heard... HEART:  Regular Rhythm; without murmurs/ rubs/ or gallops detected... ABDOMEN:  Soft & nontender; normal bowel sounds; no organomegaly or masses palpated... EXT: without deformities or arthritic changes; no varicose veins/ venous insuffic/ or edema. NEURO:  CN's intact; motor testing normal; sensory testing normal; gait normal & balance OK. DERM:   fine macular/papular rash along arm, torso and legs. no vesicles or excoriations noted.  RADIOLOGY DATA:  Reviewed in the EPIC EMR & discussed w/ the patient...  LABORATORY DATA:  Reviewed in the EPIC EMR & discussed w/ the patient...   Assessment:      CPX>>  HBP>  Controlled on her 3 meds; she is intol to ACE rx; continue same + diet & exercise, get wt down...  Ven Insuffic>  Aware, she knows to elim sodium, elev legs, wear support hose, & take the Lasix...  CHOL>  on Simva20 + diet; parameters are not as good this yr & asked to get on better diet, get wt down etc..  Multinod Thyroid>  On Synthroid to 47mcg/d & f/u TSH is 4.07 w/ mult complaints, she felt better on Synthroid88 therefore go back on that dose...  Overweight>  Weight is up 7# to 173# & we reviewed diet/ exercise/ wt reduction strategies...  GI> GERD, IBS w/ Constip>  She likes the LINZESS 290mg  started by Dr<Mann & she asks [place] to refill this Rx for her- ok...  Hx Renal Cell Ca w/ left nephrectomy 2005>  Followed yearly by DrGrapey & she continues stable, cancer free...  Mild DJD, LBP>  Aware, she uses OTC analgesics as needed; we discussed exercise program...     Plan:     Patient's Medications  New Prescriptions   No medications on file  Previous Medications    BRIMONIDINE (ALPHAGAN P) 0.1 % SOLN    Place 1 drop into both eyes 2 (two) times daily.   DORZOLAMIDE-TIMOLOL (COSOPT) 22.3-6.8 MG/ML OPHTHALMIC SOLUTION    Place 1 drop into both eyes 2 (two) times daily.   MELOXICAM (MOBIC) 7.5 MG TABLET    Take 1 tablet (7.5 mg total) by mouth daily.   METAXALONE (SKELAXIN) 800 MG TABLET    Take 1 tablet (800 mg total) by mouth 3 (three) times daily as needed for pain.   TRAVATAN Z 0.004 % SOLN OPHTHALMIC SOLUTION    Place 1 drop into both eyes At bedtime.  Modified Medications   Modified Medication Previous Medication   AMLODIPINE (NORVASC) 10 MG TABLET amLODipine (NORVASC) 10 MG tablet      Take 1 tablet (10 mg total) by mouth daily.    Take 1 tablet (10 mg total) by mouth daily.   FUROSEMIDE (LASIX) 20 MG TABLET furosemide (LASIX) 20 MG tablet      Take 1 tablet (20 mg total) by mouth daily.    Take 1 tablet (20 mg total) by mouth daily.   LEVOTHYROXINE (SYNTHROID) 75 MCG TABLET levothyroxine (SYNTHROID) 75 MCG tablet      Take 1 tablet (75 mcg  total) by mouth daily.    Take 1 tablet (75 mcg total) by mouth daily.   LINACLOTIDE (LINZESS) 290 MCG CAPS Linaclotide (LINZESS) 290 MCG CAPS      Take 1 capsule by mouth daily.    Take 1 capsule by mouth daily.   METOPROLOL (LOPRESSOR) 50 MG TABLET metoprolol (LOPRESSOR) 50 MG tablet      Take 1 tablet (50 mg total) by mouth 2 (two) times daily.    Take 1 tablet (50 mg total) by mouth 2 (two) times daily.   SIMVASTATIN (ZOCOR) 20 MG TABLET simvastatin (ZOCOR) 20 MG tablet      Take 1 tablet (20 mg total) by mouth every evening.    Take 1 tablet (20 mg total) by mouth every evening.  Discontinued Medications   No medications on file

## 2013-05-22 ENCOUNTER — Telehealth: Payer: Self-pay | Admitting: Pulmonary Disease

## 2013-05-22 ENCOUNTER — Other Ambulatory Visit: Payer: BC Managed Care – PPO

## 2013-05-22 DIAGNOSIS — Z0000 Encounter for general adult medical examination without abnormal findings: Secondary | ICD-10-CM

## 2013-05-22 NOTE — Telephone Encounter (Signed)
Per SN FLP, BMET, cbc diff, tsh, UA, liver function  Orders placed. Pt aware. Nothing further was needed

## 2013-05-22 NOTE — Telephone Encounter (Signed)
Please advise on labs to be ordered. Thank you!  Last OV 05/19/13: Patient Instructions    Today we updated your med list in our EPIC system...  Continue your current medications the same...  We refilled your meds per request...  Please return to our lab one morning this week for your FASTING blood work...  We will contact you w/ the results when available...  Keep up the good work w/ diet & exercise> it is the only true path to weight reduction...  Call for any questions...  Let's plan a follow up visit in 105yr, sooner if needed for problems.Marland KitchenMarland Kitchen

## 2013-05-25 ENCOUNTER — Other Ambulatory Visit (INDEPENDENT_AMBULATORY_CARE_PROVIDER_SITE_OTHER): Payer: BC Managed Care – PPO

## 2013-05-25 DIAGNOSIS — Z0000 Encounter for general adult medical examination without abnormal findings: Secondary | ICD-10-CM

## 2013-05-25 LAB — URINALYSIS
Hgb urine dipstick: NEGATIVE
Nitrite: NEGATIVE
Specific Gravity, Urine: >=1.03 (ref 1.000–1.030)
Urine Glucose: NEGATIVE

## 2013-05-25 LAB — LIPID PANEL
HDL: 60.2 mg/dL (ref >39.00)
LDL Cholesterol: 121 mg/dL — ABNORMAL HIGH (ref 0–99)
Total CHOL/HDL Ratio: 3
Triglycerides: 83 mg/dL (ref 0.0–149.0)
VLDL: 16.6 mg/dL (ref 0.0–40.0)

## 2013-05-25 LAB — CBC WITH DIFFERENTIAL/PLATELET
Basophils Relative: 0.5 % (ref 0.0–3.0)
Eosinophils Relative: 4.1 % (ref 0.0–5.0)
HCT: 45.1 % (ref 36.0–46.0)
Lymphs Abs: 1.7 10*3/uL (ref 0.7–4.0)
MCV: 95.9 fl (ref 78.0–100.0)
Monocytes Absolute: 0.4 10*3/uL (ref 0.1–1.0)
Monocytes Relative: 5.3 % (ref 3.0–12.0)
Neutrophils Relative %: 67.3 % (ref 43.0–77.0)
Platelets: 189 10*3/uL (ref 150.0–400.0)
RBC: 4.7 Mil/uL (ref 3.87–5.11)
WBC: 7.5 10*3/uL (ref 4.5–10.5)

## 2013-05-25 LAB — BASIC METABOLIC PANEL
Calcium: 9.6 mg/dL (ref 8.4–10.5)
Creatinine, Ser: 1.1 mg/dL (ref 0.4–1.2)
GFR: 63.64 mL/min (ref >60.00)
Sodium: 138 mEq/L (ref 135–145)

## 2013-05-25 LAB — HEPATIC FUNCTION PANEL
Alkaline Phosphatase: 76 U/L (ref 39–117)
Bilirubin, Direct: 0.1 mg/dL (ref 0.0–0.3)
Total Bilirubin: 0.5 mg/dL (ref 0.3–1.2)

## 2013-05-28 ENCOUNTER — Other Ambulatory Visit: Payer: Self-pay | Admitting: Pulmonary Disease

## 2013-05-28 MED ORDER — LEVOTHYROXINE SODIUM 88 MCG PO CAPS: 1.0000 | ORAL_CAPSULE | Freq: Every day | ORAL | Status: DC

## 2013-05-28 NOTE — Telephone Encounter (Signed)
Spoke with pt and and she is aware of lab results per SN.  She stated that she just filled the rx for the levothyroxine 75 mcg and she may take these for a while.  She requested that the 88 mcg be sent to the pharmacy for #90 day supply and pt is aware to call me and let me know when her 3 month will be to come in and get her labs once she starts the 88 mcg.

## 2013-09-15 ENCOUNTER — Other Ambulatory Visit (INDEPENDENT_AMBULATORY_CARE_PROVIDER_SITE_OTHER): Payer: BC Managed Care – PPO

## 2013-09-15 ENCOUNTER — Other Ambulatory Visit: Payer: Self-pay | Admitting: Pulmonary Disease

## 2013-09-15 DIAGNOSIS — I10 Essential (primary) hypertension: Secondary | ICD-10-CM

## 2013-09-15 DIAGNOSIS — E042 Nontoxic multinodular goiter: Secondary | ICD-10-CM

## 2013-09-15 LAB — BASIC METABOLIC PANEL
BUN: 10 mg/dL (ref 6–23)
CO2: 28 mEq/L (ref 19–32)
Calcium: 9.2 mg/dL (ref 8.4–10.5)
Chloride: 104 mEq/L (ref 96–112)
Creatinine, Ser: 1.1 mg/dL (ref 0.4–1.2)
Potassium: 4.7 mEq/L (ref 3.5–5.1)

## 2013-09-15 LAB — TSH: TSH: 1.02 u[IU]/mL (ref 0.35–5.50)

## 2013-10-25 DIAGNOSIS — H401133 Primary open-angle glaucoma, bilateral, severe stage: Secondary | ICD-10-CM | POA: Insufficient documentation

## 2013-12-01 ENCOUNTER — Other Ambulatory Visit: Payer: Self-pay

## 2013-12-01 DIAGNOSIS — Z1231 Encounter for screening mammogram for malignant neoplasm of breast: Secondary | ICD-10-CM

## 2013-12-07 ENCOUNTER — Ambulatory Visit
Admission: RE | Admit: 2013-12-07 | Discharge: 2013-12-07 | Disposition: A | Discharge status: Home / Self Care | Payer: BC Managed Care – PPO | Source: Ambulatory Visit

## 2013-12-07 DIAGNOSIS — Z1231 Encounter for screening mammogram for malignant neoplasm of breast: Secondary | ICD-10-CM

## 2013-12-08 ENCOUNTER — Telehealth: Payer: Self-pay | Admitting: Pulmonary Disease

## 2013-12-08 MED ORDER — LEVOTHYROXINE SODIUM 88 MCG PO CAPS: 1.0000 | ORAL_CAPSULE | Freq: Every day | ORAL | Status: DC

## 2013-12-08 NOTE — Telephone Encounter (Signed)
Called and spoke with pt and she stated that she needed a refill of levothyroxine 88 to be sent to [facility] on [address].  This has been sent to the pharmacy and nothing further is needed.

## 2013-12-17 ENCOUNTER — Telehealth: Payer: Self-pay | Admitting: Pulmonary Disease

## 2013-12-17 NOTE — Telephone Encounter (Signed)
RX was sent to [facility] on 12/08/13.  ATC pt lmtcb x1

## 2013-12-18 MED ORDER — LEVOTHYROXINE SODIUM 88 MCG PO TABS: 88.0000 ug | ORAL_TABLET | Freq: Every day | ORAL | Status: DC

## 2013-12-18 NOTE — Telephone Encounter (Signed)
Spoke with the pt  She states that the pharm did receive the rx but it was too expensive and it was a med that she had never heard of before  I called the pharm  Was advised that the rx was for capsule form and this is more expensive and is a different name  I changed this to the tablet form so that she can get 90 days for 10 dollars Called and informed the pt and she verbalized understanding  Nothing further needed

## 2014-05-02 ENCOUNTER — Other Ambulatory Visit: Payer: Self-pay | Admitting: Pulmonary Disease

## 2014-06-21 ENCOUNTER — Ambulatory Visit: Payer: BC Managed Care – PPO | Admitting: Pulmonary Disease

## 2014-06-22 ENCOUNTER — Ambulatory Visit (INDEPENDENT_AMBULATORY_CARE_PROVIDER_SITE_OTHER): Payer: BC Managed Care – PPO | Admitting: Pulmonary Disease

## 2014-06-22 ENCOUNTER — Encounter: Payer: Self-pay | Admitting: Pulmonary Disease

## 2014-06-22 VITALS — BP 120/78 | HR 42 | Temp 98.8°F | Ht 62.0 in | Wt 161.0 lb

## 2014-06-22 DIAGNOSIS — C649 Malignant neoplasm of unspecified kidney, except renal pelvis: Secondary | ICD-10-CM

## 2014-06-22 DIAGNOSIS — M1990 Unspecified osteoarthritis, unspecified site: Secondary | ICD-10-CM

## 2014-06-22 DIAGNOSIS — I872 Venous insufficiency (chronic) (peripheral): Secondary | ICD-10-CM

## 2014-06-22 DIAGNOSIS — E7800 Pure hypercholesterolemia, unspecified: Secondary | ICD-10-CM

## 2014-06-22 DIAGNOSIS — C642 Malignant neoplasm of left kidney, except renal pelvis: Secondary | ICD-10-CM

## 2014-06-22 DIAGNOSIS — E042 Nontoxic multinodular goiter: Secondary | ICD-10-CM

## 2014-06-22 DIAGNOSIS — K219 Gastro-esophageal reflux disease without esophagitis: Secondary | ICD-10-CM

## 2014-06-22 DIAGNOSIS — K5909 Other constipation: Secondary | ICD-10-CM

## 2014-06-22 DIAGNOSIS — I10 Essential (primary) hypertension: Secondary | ICD-10-CM

## 2014-06-22 DIAGNOSIS — K589 Irritable bowel syndrome without diarrhea: Secondary | ICD-10-CM

## 2014-06-22 DIAGNOSIS — F411 Generalized anxiety disorder: Secondary | ICD-10-CM

## 2014-06-22 DIAGNOSIS — F419 Anxiety disorder, unspecified: Secondary | ICD-10-CM

## 2014-06-22 MED ORDER — SIMVASTATIN 20 MG PO TABS: ORAL_TABLET | ORAL | Status: DC

## 2014-06-22 MED ORDER — LEVOTHYROXINE SODIUM 88 MCG PO TABS: 88.0000 ug | ORAL_TABLET | Freq: Every day | ORAL | Status: DC

## 2014-06-22 MED ORDER — LOSARTAN POTASSIUM 50 MG PO TABS: 50.0000 mg | ORAL_TABLET | Freq: Every day | ORAL | Status: DC

## 2014-06-22 MED ORDER — FUROSEMIDE 20 MG PO TABS: 20.0000 mg | ORAL_TABLET | Freq: Every day | ORAL | Status: DC

## 2014-06-22 MED ORDER — AMLODIPINE BESYLATE 10 MG PO TABS: 10.0000 mg | ORAL_TABLET | Freq: Every day | ORAL | Status: DC

## 2014-06-22 NOTE — Patient Instructions (Signed)
Today we updated your med list in our EPIC system...    Continue your current medications the same...    We refilled your meds per request...  We decided to STOP the Metoprolol due to your slow heart rate and substitute LOSARTAN 50mg  - take one tab daily...  Please monitor your BP & pulse at home & call for any questions...    Give [place] a call in 1 month w/ your BP & pulse data to see if we need to adjust any of your meds...  Please return to our lab one morning this week for your FASTING blood work...    We will contact you w/ the results when available...   Let's plan a follow up visit in 55yr, sooner if needed for problems.Marland KitchenMarland Kitchen

## 2014-06-22 NOTE — Progress Notes (Addendum)
Subjective:     Patient ID: Savannah Hubbard, female   DOB: 1956-05-14, 58 y.o.   MRN: [id number]  HPI 58 y/o BF here for a follow up visit... she has multiple medical problems as listed below>> ~  SEE PREV EPIC NOTES FOR OLDER DATA >>   ~  Apr 02, 2012:  [place] overall; recently increased exercise & having some discomfort in her right side, worse w/ movement; exam is neg & she is advised to back off slightly, use heat/ myoflex, etc... She also notes that Pharm didn't change her Synthroid from 862m/d to 7531md until her most recent refill last month?!* I indicated she was due for FASTING blood work & we will take this into account & rec continuing the 68m25mose for now...  We reviewed her Problems, Meds, XRays, & Labs> see prob list below>>  LABS 5/13:  FLP- wnl on Simva20;  Chem- wnl;  CBC- wnl;  TSH=0.85;  UA- clear...  ~  May 19, 2013:  35mo22mo& Savannah Hubbard is upset about her weight (difficulty losing weight), notes thinning hair, & fatigue- she wonders about her thyroid (currently on Synthroid75 w/ last TSH=0.85); follow up fasting blood work is pending & she is reassured that we will recheck her definitive TFT w/ this blood work;  We reviewed the following medical problems during today's office visit >>     Glaucoma> on 3 drops per Ophthalmology...    HBP> on Metoprolol50Bid, Amlod10, Lasix20; BP= 118/72 & she denies HA, CP, palpit, SOB, edema, etc...    Ven Insuffic> she knows to elim sodium, elev legs, wear support hose, on Lasix20...    CHOL> on Simva20; last FLP 5/13 showed TChol 126, TG 84, HDL 45, LDL 64; she will ret for fasting labs=> TChol 198, TG 83, HDL 60, LDL 121 (rec- better diet, same med for now).    Multinod Thyroid> on Synthroid75; she is c/o fatigue, thinning hair, wt gain; Labs 5/13 showed TSH= 0.85; she will ret for fasting blood work=> TSH=4.07 (OK to incr back to 88mcg38m    Overweight> weight= 173# which is up 7# in the last yr; we reviewed diet,  exercise, wt reduction strategies...    GI- GERD, IBS, Constip, Hems> on Linzess290; [facility], she had colonoscopy 5/13 showing hems only...    Hx RenalCellCa> followed by DrGrapey- s/p left radical nephrectomy 2005, last seen 11/13- stable w/o signs of recurrence or mets; Creat stable at 1.1    DJD, LBP> she saw TP 12/13 w/ right back pain- treated w/ Mobic, Skelaxin and improved...  We reviewed prob list, meds, xrays and labs> see below for updates >>   LABS 6/14:  FLP- fair w/ incr LDL to 121 on same meds;  Chems- ok x BS=108;  CBC-wnl;  TSH=4.07;  UA- clear... Due to her symptoms and complaints- rec to pt to incr Synthroid back to 88mcg/35mse & recheck TSH in 3 mo...  ~  June 22, 2014:  35mo RO40moecheck mult medical problems> Savannah Japneetsed that she has lost 11# down to 161# today; she states that RN at work noted slow heart rate in 40's & suggested f/u here...      Glaucoma> she reports Laser surg by DrJBond,Nani Ravens/14; on 3 drops & Methazolamide50Bid w/ improved ocular pressures by her hx...     HBP> on Metoprolol50Bid, Amlod10, Lasix20; BP= 120/78 but pulse in 40's; she denies HA, CP, palpit, SOB,  edema, etc; states her energy is good; EKG w/ SBrady, rate 44, otherw wnl; Rec to stop BBlocker & change it to [facility]...     Ven Insuffic> she knows to elim sodium, elev legs, wear support hose, on Lasix20...    CHOL> on Simva20; last FLP 7/15 showed TChol 135, TG 79, HDL 44, LDL 75; rec better diet & continue same med...    Multinod Thyroid> on Synthroid88; she is feeling some better overall & f/u TSH 10/14 was 1.02; Labs 7/15 showed TSH=     Overweight> weight= 161# which is down 11# in the last yr; we reviewed diet, exercise, wt reduction strategies...    GI- GERD, IBS, Constip, Hems> on Linzess290; followed by [facility], she had colonoscopy 5/13 showing hems only...    Hx RenalCellCa> followed by DrGrapey- s/p left radical nephrectomy 2005, last seen  11/13- stable w/o signs of recurrence or mets; Creat stable at 1.1    DJD, LBP> she saw TP 12/13 w/ right back pain- treated w/ Mobic, Skelaxin and improved...  We reviewed prob list, meds, xrays and labs> see below for updates >>   EKG 7/15 showed SBrady, rate44, otherw wnl; we decided to stop Metoprolol & switch to Losartan50...  LABS 7/15: FLP- at goals on Simva20, continue same;  Chems- ok x K=3.3 rec to start K20/d;  CBC- wnl;  TSH=0.33 on Synthroid88 & she prefers this dose...           PROBLEM LIST:       HYPERTENSION (ICD-401.9) >>  ~  Baseline EKG 2009 showed SBrady, rate56, wnl, NAD...  ~  on METOPROLOL 23mBid,  AMLODIPNE 189md & LASIX 2024m... she is ACE intolerant...  ~  3/12:  BP= 136/88 and she reports 120's/ 70's at home... denies HA, fatigue, visual changes, CP, palipit, dizziness, syncope, dyspnea, edema, etc... since she has only one kidney we discussed better control needed (Bisoprolol changed to Metoprolol)... ~  5/13:  BP= 120/84 & similar at home; she remains asymptomatic & stable on these 3 meds... ~  CXR 11/13 showed norm heart size, clear lungs, NAD...  ~  6/14: on Metoprolol50Bid, Amlod10, Lasix20; BP= 118/72 & she denies HA, CP, palpit, SOB, edema, etc... ~  7/15: on Metoprolol50Bid, Amlod10, Lasix20; BP= 120/78 but pulse in 40's; she denies HA, CP, palpit, SOB, edema, etc; states her energy is good; EKG w/ SBrady, rate 44, otherw wnl; Rec to stop [facility]Blocker & [facility]  VENOUS INSUFFICIENCY (ICD-459.81) - she has mild VI and knows to avoid sodium, elevate legs, wear support hose, & take the Lasix Qam...  HYPERCHOLESTEROLEMIA (ICD-272.0) - on SIMVASTATIN 28m29mstarted 8/09... ~  FLP [place]7 showed TChol 187, TG 108, HDL 38, LDL 128... she prefered diet alone. ~  FLP [place]9 showed TChol 187, TG 109, HDL 35, LDL 130... rec to start Simvastatin20. ~  [facility] TChol 153, TG 70, HDL 47, LDL 92... rec> continue Simva20... ~  Labs 3/12 on  Simva20 showed TChol 161, TG 148, HDL 43, LDL 89 ~  Labs 5/13 on Simva20 showed TChol 126, TG 84, HDL 45, LDL 64 ~  Labs 6/14 on Simva20 showed TChol 198, TG 83, HDL 60, LDL 121 (what happened? rec same med, better diet, get wt down). ~  Labs 7/15 on Simva20 showed TChol 135, TG 79, HDL 44, LDL 75  NONTOXIC MULTINODULAR GOITER (ICD-241.1) - on SYNTHROID 75mc47m(?decreased from 88 to 75 in 2012 but pharm didn't execute this  order til 2013?)... hx palp thyroid on exam w/ eval in 2005 showing inhomogeneous uptake bilat w/ sonar showing at least 6 sm nodules- mixed solid & cystic lesions c/w multinodular gland... Synthroid suppression started and dose adjusted... ~  labs 7/08 showed TSH= 0.16 ~  labs 8/09 showed TSH= 0.27 ~  labs 1/10 at [facility] = TSH= 0.42, T4= 11.1, T3uptake= 29, FTI= 3.2.[name]. rec> continue same dose. ~  Labs 3/12 on Levothy88 showed TSH= 0.18 and dose decr from 68mg/d to 722m/d... ~  Labs 5/13 on Levothy75 for a few weeks showed TSH=0.85... We decided to continue the 7537md dose... ~  6/14: she is c/o fatigue, thinning hair, wt gain; Labs on Synthroid75 showed TSH= 4.07 & she is rec to incr Synthroid back to 25m26m... ~  Labs 10/14 on Synthroid88 showed TSH= 1.02 ~  Labs 7/15 on Synthroid88 showed TSH= 0.33  OVERWEIGHT (ICD-278.02) - we reviewed diet, exercise, wt reduction strategies... ~  Weight 3/12 = 170# ~  Weight 5/13 = 166# ~  Weight 6/14 = 173# ~  Weight 7/15 = 161#... great job on diet, exercise, wt reduction...  GERD (ICD-530.81) - min reflux symptoms intermittently- OTC Rx as needed.  IRRITABLE BOWEL SYNDROME & CONSTIPATION >> she had a neg colonoscopy in 1989 by DrPatterson ([facility])... ~  5/13:  She tells me that [facility], DrStringer; she has Constipation predom IBS & was in the habit of using a suppos every day to go; DrMann started [facility] pt reports that  this has helped a lot & she requests that I refill this med for her- ok... ~  [facility]- otherw neg...   GYN >> she sees DrStringer for her GYN needs; she assures me that she is up to date & doing well...  RENAL CELL CANCER (ICD-189.0) - she had an incidental left renal mass found on CTAbd done as part of a research protocol at [employer] (her brother had DM and they were studying the siblings)...  5x6cm left lower pole mass found and removed by DrGrapey 10/05= renal cell cancer... she sees DrGrapey yearly now- last CTAbd in 2007 was OK... CXR & Labs here w/ copy to DrGrapey... ~  11/13: she had yearly f/u eval DrGrapey> s/p left radical nephrectomy 2005, no signs of recurrence or mets, CXR was reported clear...  ~  We do not have Urology note from 2014 in Epic [place]...  DEGENERATIVE JOINT DISEASE (ICD-715.90) - she has had LBP, left hip pain, and shoulder discomfort... all Rx w/ MOBIC 7.5mg P7m.. ~  12/13: she saw TP w/ right side pain> feels like a catch, Rx w/ Mobic, heat, Skelaxin...   BACK PAIN, LUMBAR (ICD-724.2)  ANXIETY >> some stress w/ husb's open heart surg in 2012...   Health Maintenance - her GYN is DrStringer and she is s/p hysterectomy... [facility]... Her GI is DrNat-Mann & she had colonoscopy 5/13 as above...    Past Surgical History  Procedure Laterality Date  . Abdominal hysterectomy    . Left nephrectomy for renal cell cancer  2005    Dr. GrapeyRisa Grilltpatient Encounter Prescriptions as of 06/22/2014  Medication Sig  . amLODipine (NORVASC) 10 MG tablet Take 1 tablet (10 mg total) by mouth daily.  . brimonidine (ALPHAGAN P) 0.1 % SOLN Place  1 drop into both eyes 2 (two) times daily.  . dorzolamide-timolol (COSOPT) 22.3-6.8 MG/ML ophthalmic solution Place 1 drop into both eyes 2 (two) times daily.  . furosemide (LASIX) 20 MG tablet Take 1 tablet (20 mg total)  by mouth daily.  [name] levothyroxine (SYNTHROID, LEVOTHROID) 88 MCG tablet Take 1 tablet (88 mcg total) by mouth daily before breakfast.  . LINZESS 290 MCG CAPS capsule TAKE 1 CAPSULE BY MOUTH DAILY.  . meloxicam (MOBIC) 7.5 MG tablet Take 1 tablet (7.5 mg total) by mouth daily.  . methazolamide (NEPTAZANE) 50 MG tablet Take 50 mg by mouth 2 (two) times daily.  . simvastatin (ZOCOR) 20 MG tablet TAKE 1 TABLET (20 MG TOTAL) BY MOUTH EVERY EVENING.  . TRAVATAN Z 0.004 % SOLN ophthalmic solution Place 1 drop into both eyes At bedtime.  . [DISCONTINUED] amLODipine (NORVASC) 10 MG tablet Take 1 tablet (10 mg total) by mouth daily.  . [DISCONTINUED] furosemide (LASIX) 20 MG tablet Take 1 tablet (20 mg total) by mouth daily.  . [DISCONTINUED] levothyroxine (SYNTHROID, LEVOTHROID) 88 MCG tablet Take 1 tablet (88 mcg total) by mouth daily before breakfast.  . [DISCONTINUED] Levothyroxine Sodium 88 MCG CAPS Take 1 capsule (88 mcg total) by mouth daily before breakfast.  . [DISCONTINUED] metoprolol (LOPRESSOR) 50 MG tablet Take 1 tablet (50 mg total) by mouth 2 (two) times daily.  . [DISCONTINUED] simvastatin (ZOCOR) 20 MG tablet TAKE 1 TABLET (20 MG TOTAL) BY MOUTH EVERY EVENING.  [name] losartan (COZAAR) 50 MG tablet Take 1 tablet (50 mg total) by mouth daily.  . [DISCONTINUED] metaxalone (SKELAXIN) 800 MG tablet Take 1 tablet (800 mg total) by mouth 3 (three) times daily as needed for pain.    Allergies  Allergen Reactions  . Lisinopril     angioedema  . Morphine     REACTION: ITCHING  . Sulfonamide Derivatives     REACTION: BURNING, IRRITATION    Current Medications, Allergies, Past Medical History, Past Surgical History, Family History, and Social History were reviewed in [employer] record.   Review of Systems    See HPI - all other systems neg except as noted... The patient denies anorexia, fever, weight loss, weight gain, vision loss, decreased hearing, hoarseness, chest  pain, syncope, dyspnea on exertion, peripheral edema, prolonged cough, headaches, hemoptysis, abdominal pain, melena, hematochezia, severe indigestion/heartburn, hematuria, incontinence, muscle weakness, suspicious skin lesions, transient blindness, difficulty walking, depression, unusual weight change, abnormal bleeding, enlarged lymph nodes, and angioedema.     Objective:   Physical Exam      WD, WN, 58 y/o BF in NAD... GENERAL:  Alert & oriented; pleasant & cooperative... HEENT:  [place]/AT, EOM-wnl, PERRLA, Glasses, EACs-clear, TMs-wnl, NOSE-clear, THROAT-clear & wnl. NECK:  Supple w/ full ROM; no JVD; normal carotid impulses w/o bruits; palp thyroid w/ nodular feel; no lymphadenopathy. CHEST:  Clear to P & A; without wheezes/ rales/ or rhonchi heard... HEART:  Regular Rhythm; without murmurs/ rubs/ or gallops detected... ABDOMEN:  Soft & nontender; normal bowel sounds; no organomegaly or masses palpated... EXT: without deformities or arthritic changes; no varicose veins/ venous insuffic/ or edema. NEURO:  CN's intact; motor testing normal; sensory testing normal; gait normal & balance OK. DERM:   Neg- no rash, lesions, etc...  RADIOLOGY DATA:  Reviewed in the EPIC EMR & discussed w/ the patient...  LABORATORY DATA:  Reviewed in the EPIC EMR & discussed w/ the patient...   Assessment:     HBP>  Controlled on her 3  meds; she is intol to ACE rx; but she has sinus brady w/ rate in the 40's; we decided to STOP the BBlocker & add Losartan50, monitor BP & heart rate at rest & w/ exercise...  Ven Insuffic>  Aware, she knows to elim sodium, elev legs, wear support hose, & take the Lasix...  CHOL>  on Simva20 + diet; parameters are not as good this yr & asked to get on better diet, get wt down etc..  Multinod Thyroid>  On Synthroid 76mg/d & TSH is improved, pt feels better as well...  Overweight>  Weight is down 11# to 161# & we reviewed diet/ exercise/ wt reduction strategies...  GI> GERD,  IBS w/ Constip>  [facility]Mann & she asks usKoreao refill this Rx for her- ok...  Hx Renal Cell Ca w/ left nephrectomy 2005>  Followed yearly by DrGrapey & she continues stable, cancer free...  Mild DJD, LBP>  Aware, she uses OTC analgesics as needed; we discussed exercise program...     Plan:     Patient's Medications  New Prescriptions   LOSARTAN (COZAAR) 50 MG TABLET    Take 1 tablet (50 mg total) by mouth daily.  Previous Medications   BRIMONIDINE (ALPHAGAN P) 0.1 % SOLN    Place 1 drop into both eyes 2 (two) times daily.   DORZOLAMIDE-TIMOLOL (COSOPT) 22.3-6.8 MG/ML OPHTHALMIC SOLUTION    Place 1 drop into both eyes 2 (two) times daily.   LINZESS 290 MCG CAPS CAPSULE    TAKE 1 CAPSULE BY MOUTH DAILY.   MELOXICAM (MOBIC) 7.5 MG TABLET    Take 1 tablet (7.5 mg total) by mouth daily.   METHAZOLAMIDE (NEPTAZANE) 50 MG TABLET    Take 50 mg by mouth 2 (two) times daily.   TRAVATAN Z 0.004 % SOLN OPHTHALMIC SOLUTION    Place 1 drop into both eyes At bedtime.  Modified Medications   Modified Medication Previous Medication   AMLODIPINE (NORVASC) 10 MG TABLET amLODipine (NORVASC) 10 MG tablet      Take 1 tablet (10 mg total) by mouth daily.    Take 1 tablet (10 mg total) by mouth daily.   FUROSEMIDE (LASIX) 20 MG TABLET furosemide (LASIX) 20 MG tablet      Take 1 tablet (20 mg total) by mouth daily.    Take 1 tablet (20 mg total) by mouth daily.   LEVOTHYROXINE (SYNTHROID, LEVOTHROID) 88 MCG TABLET levothyroxine (SYNTHROID, LEVOTHROID) 88 MCG tablet      Take 1 tablet (88 mcg total) by mouth daily before breakfast.    Take 1 tablet (88 mcg total) by mouth daily before breakfast.   SIMVASTATIN (ZOCOR) 20 MG TABLET simvastatin (ZOCOR) 20 MG tablet      TAKE 1 TABLET (20 MG TOTAL) BY MOUTH EVERY EVENING.    TAKE 1 TABLET (20 MG TOTAL) BY MOUTH EVERY EVENING.  Discontinued Medications   LEVOTHYROXINE SODIUM 88 MCG CAPS    Take 1 capsule (88 mcg total) by mouth daily  before breakfast.   METAXALONE (SKELAXIN) 800 MG TABLET    Take 1 tablet (800 mg total) by mouth 3 (three) times daily as needed for pain.   METOPROLOL (LOPRESSOR) 50 MG TABLET    Take 1 tablet (50 mg total) by mouth 2 (two) times daily.

## 2014-06-28 ENCOUNTER — Other Ambulatory Visit (INDEPENDENT_AMBULATORY_CARE_PROVIDER_SITE_OTHER): Payer: BC Managed Care – PPO

## 2014-06-28 DIAGNOSIS — F419 Anxiety disorder, unspecified: Secondary | ICD-10-CM

## 2014-06-28 DIAGNOSIS — E042 Nontoxic multinodular goiter: Secondary | ICD-10-CM

## 2014-06-28 DIAGNOSIS — F411 Generalized anxiety disorder: Secondary | ICD-10-CM

## 2014-06-28 DIAGNOSIS — E7800 Pure hypercholesterolemia, unspecified: Secondary | ICD-10-CM

## 2014-06-28 DIAGNOSIS — I10 Essential (primary) hypertension: Secondary | ICD-10-CM

## 2014-06-28 LAB — LIPID PANEL
Cholesterol: 135 mg/dL (ref 0–200)
HDL: 44.1 mg/dL (ref >39.00)
LDL CALC: 75 mg/dL (ref 0–99)
NONHDL: 90.9
Total CHOL/HDL Ratio: 3
Triglycerides: 79 mg/dL (ref 0.0–149.0)
VLDL: 15.8 mg/dL (ref 0.0–40.0)

## 2014-06-28 LAB — CBC WITH DIFFERENTIAL/PLATELET
Basophils Absolute: 0 10*3/uL (ref 0.0–0.1)
Basophils Relative: 0.4 % (ref 0.0–3.0)
EOS ABS: 0.1 10*3/uL (ref 0.0–0.7)
EOS PCT: 1.6 % (ref 0.0–5.0)
HCT: 42.2 % (ref 36.0–46.0)
Hemoglobin: 14.1 g/dL (ref 12.0–15.0)
Lymphocytes Relative: 24.8 % (ref 12.0–46.0)
Lymphs Abs: 1.5 10*3/uL (ref 0.7–4.0)
MCHC: 33.5 g/dL (ref 30.0–36.0)
MCV: 94.4 fl (ref 78.0–100.0)
Monocytes Absolute: 0.5 10*3/uL (ref 0.1–1.0)
Monocytes Relative: 7.3 % (ref 3.0–12.0)
NEUTROS ABS: 4.1 10*3/uL (ref 1.4–7.7)
Neutrophils Relative %: 65.9 % (ref 43.0–77.0)
Platelets: 206 10*3/uL (ref 150.0–400.0)
RBC: 4.47 Mil/uL (ref 3.87–5.11)
RDW: 12.8 % (ref 11.5–15.5)
WBC: 6.2 10*3/uL (ref 4.0–10.5)

## 2014-06-28 LAB — BASIC METABOLIC PANEL
BUN: 11 mg/dL (ref 6–23)
CALCIUM: 9 mg/dL (ref 8.4–10.5)
CO2: 24 mEq/L (ref 19–32)
CREATININE: 1 mg/dL (ref 0.4–1.2)
Chloride: 106 mEq/L (ref 96–112)
GFR: 72.06 mL/min (ref >60.00)
Glucose, Bld: 97 mg/dL (ref 70–99)
Potassium: 3.3 mEq/L — ABNORMAL LOW (ref 3.5–5.1)
Sodium: 136 mEq/L (ref 135–145)

## 2014-06-28 LAB — HEPATIC FUNCTION PANEL
ALT: 10 U/L (ref 0–35)
AST: 19 U/L (ref 0–37)
Albumin: 3.8 g/dL (ref 3.5–5.2)
Alkaline Phosphatase: 73 U/L (ref 39–117)
BILIRUBIN DIRECT: 0.1 mg/dL (ref 0.0–0.3)
BILIRUBIN TOTAL: 0.7 mg/dL (ref 0.2–1.2)
Total Protein: 7.3 g/dL (ref 6.0–8.3)

## 2014-06-28 LAB — TSH: TSH: 0.33 u[IU]/mL — ABNORMAL LOW (ref 0.35–4.50)

## 2014-07-05 ENCOUNTER — Other Ambulatory Visit: Payer: Self-pay | Admitting: Pulmonary Disease

## 2014-07-05 MED ORDER — POTASSIUM CHLORIDE CRYS ER 20 MEQ PO TBCR: 20.0000 meq | EXTENDED_RELEASE_TABLET | Freq: Every day | ORAL | Status: DC

## 2015-01-18 ENCOUNTER — Other Ambulatory Visit: Payer: Self-pay | Admitting: Pulmonary Disease

## 2015-03-23 ENCOUNTER — Other Ambulatory Visit: Payer: Self-pay

## 2015-03-23 DIAGNOSIS — Z1231 Encounter for screening mammogram for malignant neoplasm of breast: Secondary | ICD-10-CM

## 2015-04-05 ENCOUNTER — Ambulatory Visit
Admission: RE | Admit: 2015-04-05 | Discharge: 2015-04-05 | Disposition: A | Discharge status: Home / Self Care | Payer: BLUE CROSS/BLUE SHIELD | Source: Ambulatory Visit

## 2015-04-05 DIAGNOSIS — Z1231 Encounter for screening mammogram for malignant neoplasm of breast: Secondary | ICD-10-CM

## 2015-04-28 ENCOUNTER — Other Ambulatory Visit: Payer: Self-pay | Admitting: Pulmonary Disease

## 2015-04-30 ENCOUNTER — Other Ambulatory Visit: Payer: Self-pay | Admitting: Pulmonary Disease

## 2015-05-01 ENCOUNTER — Other Ambulatory Visit: Payer: Self-pay | Admitting: Pulmonary Disease

## 2015-05-12 ENCOUNTER — Other Ambulatory Visit: Payer: Self-pay | Admitting: Pulmonary Disease

## 2015-05-12 NOTE — Telephone Encounter (Signed)
Patient last seen 7/15  Pt needs 1 year f/u appt Linzess/Atorvastatin refill.

## 2015-06-30 ENCOUNTER — Ambulatory Visit (INDEPENDENT_AMBULATORY_CARE_PROVIDER_SITE_OTHER): Payer: BLUE CROSS/BLUE SHIELD | Admitting: Pulmonary Disease

## 2015-06-30 ENCOUNTER — Encounter: Payer: Self-pay | Admitting: Pulmonary Disease

## 2015-06-30 ENCOUNTER — Ambulatory Visit (INDEPENDENT_AMBULATORY_CARE_PROVIDER_SITE_OTHER)
Admission: RE | Admit: 2015-06-30 | Discharge: 2015-06-30 | Disposition: A | Discharge status: Home / Self Care | Payer: BLUE CROSS/BLUE SHIELD | Source: Ambulatory Visit | Attending: Pulmonary Disease | Admitting: Pulmonary Disease

## 2015-06-30 ENCOUNTER — Other Ambulatory Visit (INDEPENDENT_AMBULATORY_CARE_PROVIDER_SITE_OTHER): Payer: BLUE CROSS/BLUE SHIELD

## 2015-06-30 VITALS — BP 116/80 | HR 58 | Temp 98.2°F | Wt 150.0 lb

## 2015-06-30 DIAGNOSIS — I10 Essential (primary) hypertension: Secondary | ICD-10-CM

## 2015-06-30 DIAGNOSIS — Z1211 Encounter for screening for malignant neoplasm of colon: Secondary | ICD-10-CM | POA: Insufficient documentation

## 2015-06-30 DIAGNOSIS — E7800 Pure hypercholesterolemia, unspecified: Secondary | ICD-10-CM

## 2015-06-30 DIAGNOSIS — E042 Nontoxic multinodular goiter: Secondary | ICD-10-CM

## 2015-06-30 DIAGNOSIS — F419 Anxiety disorder, unspecified: Secondary | ICD-10-CM

## 2015-06-30 DIAGNOSIS — Z0000 Encounter for general adult medical examination without abnormal findings: Secondary | ICD-10-CM

## 2015-06-30 DIAGNOSIS — C642 Malignant neoplasm of left kidney, except renal pelvis: Secondary | ICD-10-CM

## 2015-06-30 LAB — URINALYSIS, ROUTINE W REFLEX MICROSCOPIC
BILIRUBIN URINE: NEGATIVE
Hgb urine dipstick: NEGATIVE
Ketones, ur: NEGATIVE
LEUKOCYTES UA: NEGATIVE
Nitrite: NEGATIVE
SPECIFIC GRAVITY, URINE: 1.02 (ref 1.000–1.030)
TOTAL PROTEIN, URINE-UPE24: NEGATIVE
UROBILINOGEN UA: 0.2 (ref 0.0–1.0)
Urine Glucose: NEGATIVE
pH: 6 (ref 5.0–8.0)

## 2015-06-30 LAB — CBC WITH DIFFERENTIAL/PLATELET
BASOS ABS: 0 10*3/uL (ref 0.0–0.1)
BASOS PCT: 0.3 % (ref 0.0–3.0)
Eosinophils Absolute: 0.2 10*3/uL (ref 0.0–0.7)
Eosinophils Relative: 2.6 % (ref 0.0–5.0)
HEMATOCRIT: 42.1 % (ref 36.0–46.0)
Hemoglobin: 14.5 g/dL (ref 12.0–15.0)
Lymphocytes Relative: 21.8 % (ref 12.0–46.0)
Lymphs Abs: 1.4 10*3/uL (ref 0.7–4.0)
MCHC: 34.5 g/dL (ref 30.0–36.0)
MCV: 92 fl (ref 78.0–100.0)
MONO ABS: 0.5 10*3/uL (ref 0.1–1.0)
MONOS PCT: 7.5 % (ref 3.0–12.0)
Neutro Abs: 4.5 10*3/uL (ref 1.4–7.7)
Neutrophils Relative %: 67.8 % (ref 43.0–77.0)
Platelets: 243 10*3/uL (ref 150.0–400.0)
RBC: 4.57 Mil/uL (ref 3.87–5.11)
RDW: 12.4 % (ref 11.5–15.5)
WBC: 6.6 10*3/uL (ref 4.0–10.5)

## 2015-06-30 LAB — LIPID PANEL
Cholesterol: 145 mg/dL (ref 0–200)
HDL: 49.9 mg/dL (ref >39.00)
LDL Cholesterol: 81 mg/dL (ref 0–99)
NONHDL: 95.01
TRIGLYCERIDES: 71 mg/dL (ref 0.0–149.0)
Total CHOL/HDL Ratio: 3
VLDL: 14.2 mg/dL (ref 0.0–40.0)

## 2015-06-30 LAB — BASIC METABOLIC PANEL
BUN: 18 mg/dL (ref 6–23)
CALCIUM: 9.6 mg/dL (ref 8.4–10.5)
CO2: 27 mEq/L (ref 19–32)
CREATININE: 1.09 mg/dL (ref 0.40–1.20)
Chloride: 106 mEq/L (ref 96–112)
GFR: 66.51 mL/min (ref >60.00)
Glucose, Bld: 96 mg/dL (ref 70–99)
Potassium: 4.4 mEq/L (ref 3.5–5.1)
Sodium: 140 mEq/L (ref 135–145)

## 2015-06-30 LAB — HEPATIC FUNCTION PANEL
ALK PHOS: 76 U/L (ref 39–117)
ALT: 9 U/L (ref 0–35)
AST: 17 U/L (ref 0–37)
Albumin: 4.1 g/dL (ref 3.5–5.2)
BILIRUBIN TOTAL: 0.5 mg/dL (ref 0.2–1.2)
Bilirubin, Direct: 0.1 mg/dL (ref 0.0–0.3)
TOTAL PROTEIN: 7.4 g/dL (ref 6.0–8.3)

## 2015-06-30 LAB — TSH: TSH: 0.82 u[IU]/mL (ref 0.35–4.50)

## 2015-06-30 NOTE — Progress Notes (Signed)
Subjective:     Patient ID: Savannah Hubbard, female   DOB: 1956/07/05, 59 y.o.   MRN: [id number]  HPI 59 y/o BF here for a follow up visit... she has multiple medical problems as listed below>> ~  SEE PREV EPIC NOTES FOR OLDER DATA >>   ~  Apr 02, 2012:  [place]; recently increased exercise & having some discomfort in her right side, worse w/ movement; exam is neg & she is advised to back off slightly, use heat/ myoflex, etc... She also notes that Pharm didn't change her Synthroid from 837m/d to 759md until her most recent refill last month?!* I indicated she was due for FASTING blood work & we will take this into account & rec continuing the 80m27mose for now...  We reviewed her Problems, Meds, XRays, & Labs> see prob list below>>  LABS 5/13:  FLP- wnl on Simva20;  Chem- wnl;  CBC- wnl;  TSH=0.85;  UA- clear...  ~  May 19, 2013:  27mo15mo& Savannah Hubbard is upset about her weight (difficulty losing weight), notes thinning hair, & fatigue- she wonders about her thyroid (currently on Synthroid75 w/ last TSH=0.85); follow up fasting blood work is pending & she is reassured that we will recheck her definitive TFT w/ this blood work;  We reviewed the following medical problems during today's office visit >>     Glaucoma> on 3 drops per Ophthalmology...    HBP> on Metoprolol50Bid, Amlod10, Lasix20; BP= 118/72 & she denies HA, CP, palpit, SOB, edema, etc...    Ven Insuffic> she knows to elim sodium, elev legs, wear support hose, on Lasix20...    CHOL> on Simva20; last FLP 5/13 showed TChol 126, TG 84, HDL 45, LDL 64; she will ret for fasting labs=> TChol 198, TG 83, HDL 60, LDL 121 (rec- better diet, same med for now).    Multinod Thyroid> on Synthroid75; she is c/o fatigue, thinning hair, wt gain; Labs 5/13 showed TSH= 0.85; she will ret for fasting blood work=> TSH=4.07 (OK to incr back to 88mcg27m    Overweight> weight= 173# which is up 7# in the last yr; we reviewed diet,  exercise, wt reduction strategies...    GI- GERD, IBS, Constip, Hems> on Linzess290; [facility], she had colonoscopy 5/13 showing hems only...    Hx RenalCellCa> followed by DrGrapey- s/p left radical nephrectomy 2005, last seen 11/13- stable w/o signs of recurrence or mets; Creat stable at 1.1    DJD, LBP> she saw TP 12/13 w/ right back pain- treated w/ Mobic, Skelaxin and improved...  We reviewed prob list, meds, xrays and labs> see below for updates >>   LABS 6/14:  FLP- fair w/ incr LDL to 121 on same meds;  Chems- ok x BS=108;  CBC-wnl;  TSH=4.07;  UA- clear... Due to her symptoms and complaints- rec to pt to incr Synthroid back to 88mcg/35mse & recheck TSH in 3 mo...  ~  June 22, 2014:  27mo RO14moecheck mult medical problems> Savannah Lalanased that she has lost 11# down to 161# today; she states that RN at work noted slow heart rate in 40's & suggested f/u here...      Glaucoma> she reports Laser surg by DrJBond,Nani Ravens/14; on 3 drops & Methazolamide50Bid w/ improved ocular pressures by her hx...     HBP> on Metoprolol50Bid, Amlod10, Lasix20; BP= 120/78 but pulse in 40's; she denies HA, CP, palpit, SOB,  edema, etc; states her energy is good; EKG w/ SBrady, rate 44, otherw wnl; Rec to stop BBlocker & change it to [facility]...     Ven Insuffic> she knows to elim sodium, elev legs, wear support hose, on Lasix20...    CHOL> on Simva20; last FLP 7/15 showed TChol 135, TG 79, HDL 44, LDL 75; rec better diet & continue same med...    Multinod Thyroid> on Synthroid88; she is feeling some better overall & f/u TSH 10/14 was 1.02; Labs 7/15 showed TSH=     Overweight> weight= 161# which is down 11# in the last yr; we reviewed diet, exercise, wt reduction strategies...    GI- GERD, IBS, Constip, Hems> on Linzess290; followed by [facility] for GI, she had colonoscopy 5/13 showing hems only...    Hx RenalCellCa> followed by DrGrapey- s/p left radical nephrectomy 2005, last seen  11/13- stable w/o signs of recurrence or mets; Creat stable at 1.1    DJD, LBP> she saw TP 12/13 w/ right back pain- treated w/ Mobic, Skelaxin and improved...  We reviewed prob list, meds, xrays and labs> see below for updates >>   EKG 7/15 showed SBrady, rate44, otherw wnl; we decided to stop Metoprolol & switch to Losartan50...  LABS 7/15: FLP- at goals on Simva20, continue same;  Chems- ok x K=3.3 rec to start K20/d;  CBC- wnl;  TSH=0.33 on Synthroid88 & she prefers this dose...  ~  June 30, 2015:  30[place].. [place]areports a good yr- doing well on diet & exercise program w/ wt down 10# to 150# today... We reviewed the following medical problems during today's office visit >>     Glaucoma> she reports Laser surg by DrJBond, [facility] 12/[facility]; on 3 drops & Methazolamide50Bid w/ improved ocular pressures by her hx...     HBP> on Amlod10, Losar50, Lasix20; BP= 116/80 w/ pulse in 58; she denies HA, CP, palpit, SOB, edema, etc; states her energy is good off the BBlocker...    Ven Insuffic> she knows to elim sodium, elev legs, wear support hose, on Lasix20...    CHOL> on Simva20; FLP 8/16 showed TChol 145, TG 71, HDL 50, LDL 81; rec continue same med...    Multinod Thyroid> on Synthroid88; she is feeling well & f/u TSH 8/16 = 0.82    Overweight> improved! Wt is down 10# further to 150#; we reviewed diet, exercise, wt reduction strategies...    GI- GERD, IBS, Constip, Hems> on Linzess290; [facility] she had colonoscopy 5/13 showing hems only...    Hx RenalCellCa> followed by DrGrapey- s/p left radical nephrectomy 2005, last seen 11/13 & he released her- stable w/o signs of recurrence or mets; Creat stable at 1.1    DJD, LBP> she saw TP 12/13 w/ right back pain- treated w/ Mobic, Skelaxin and improved...  We reviewed prob list, meds, xrays and labs> see below for updates >>   CXR 06/30/15 showed norm heart size, clear lungs, NAD...  LABS 8/16:  FLP- at goals on Simva20;  Chems-  wnl;  CBC- wnl;  TSH=0.82;  UA- clear, no blood...           PROBLEM LIST:       HYPERTENSION (ICD-401.9) >>  ~  Baseline EKG 2009 showed SBrady, rate56, wnl, NAD...  ~  on METOPROLOL 53mid,  AMLODIPNE 102m & LASIX 42m53m.. she is ACE intolerant...  ~  3/12:  BP= 136/88 and she reports 120's/ 70's at home... denies HA, fatigue, visual  changes, CP, palipit, dizziness, syncope, dyspnea, edema, etc... since she has only one kidney we discussed better control needed (Bisoprolol changed to Metoprolol)... ~  5/13:  BP= 120/84 & similar at home; she remains asymptomatic & stable on these 3 meds... ~  CXR 11/13 showed norm heart size, clear lungs, NAD...  ~  6/14: on Metoprolol50Bid, Amlod10, Lasix20; BP= 118/72 & she denies HA, CP, palpit, SOB, edema, etc... ~  7/15: on Metoprolol50Bid, Amlod10, Lasix20; BP= 120/78 but pulse in 40's; she denies HA, CP, palpit, SOB, edema, etc; states her energy is good; EKG w/ SBrady, rate 44, otherw wnl; Rec [facility] stop [facility] & [facility]... ~  8/16: on Amlod10, Losar50, Lasix20; BP= 116/80 w/ pulse in 58; she denies HA, CP, palpit, SOB, edema, etc; states her energy is good off the BBlocker.  VENOUS INSUFFICIENCY (ICD-459.81) - she has mild VI and knows to avoid sodium, elevate legs, wear support hose, & take the Lasix Qam...  HYPERCHOLESTEROLEMIA (ICD-272.0) - on SIMVASTATIN 30m/d started 8/09... ~  FMutual6/07 showed TChol 187, TG 108, HDL 38, LDL 128... she prefered diet alone. ~  FReserve8/09 showed TChol 187, TG 109, HDL 35, LDL 130... rec to start Simvastatin20. ~  [facility] TCh[facility] 153, TG 70, HDL 47, LDL 92... rec> continue Simva20... ~  Labs 3/12 on Simva20 showed TChol 161, TG 148, HDL 43, LDL 89 ~  Labs 5/13 on Simva20 showed TChol 126, TG 84, HDL 45, LDL 64 ~  Labs 6/14 on Simva20 showed TChol 198, TG 83, HDL 60, LDL 121 (what happened? rec same med, better diet, get wt down). ~  Labs 7/15 on Simva20 showed TChol 135, TG 79, HDL  44, LDL 75 ~  Labs 8/16 on Simva20 showed TChol 145, TG 71, HDL 50, LDL 81  NONTOXIC MULTINODULAR GOITER (ICD-241.1) - on SYNTHROID 798m/d (?decreased from 88 to 75 in 2012 but pharm didn't execute this order til 2013?)... hx palp thyroid on exam w/ eval in 2005 showing inhomogeneous uptake bilat w/ sonar showing at least 6 sm nodules- mixed solid & cystic lesions c/w multinodular gland... Synthroid suppression started and dose adjusted... ~  labs 7/08 showed TSH= 0.16 ~  labs 8/09 showed TSH= 0.27 ~  [facility], T4= 11.1, T3uptake= 29, FTI= 3.2...Marland Kitchenrec> continue same dose. ~  Labs 3/12 on Levothy88 showed TSH= 0.18 and dose decr from 8815md to 61m79m... ~  Labs 5/13 on Levothy75 for a few weeks showed TSH=0.85... We decided to continue the 61mc28mdose... ~  6/14: she is c/o fatigue, thinning hair, wt gain; Labs on Synthroid75 showed TSH= 4.07 & she is rec to incr Synthroid back to 88mcg34m. ~  Labs 10/14 on Synthroid88 showed TSH= 1.02 ~  Labs 7/15 on Synthroid88 showed TSH= 0.33 ~  Labs 8/16 on Synthroid88 showed TSH= 0.82  OVERWEIGHT (ICD-278.02) - we reviewed diet, exercise, wt reduction strategies... ~  Weight 3/12 = 170# ~  Weight 5/13 = 166# ~  Weight 6/14 = 173# ~  Weight 7/15 = 161#... great job on diet, exercise, wt reduction... ~  Weight 8/16 = 150#  GERD (ICD-530.81) - min reflux symptoms intermittently- OTC Rx as needed.  IRRITABLE BOWEL SYNDROME & CONSTIPATION >> she had a neg colonoscopy in 1989 by DrPatterson ([facility])... ~  5/13:  She tells me that she [facility], DrStringer; she  has Constipation predom IBS & was in the habit of using a suppos every day to go; DrMann started [facility] 217m/d & pt reports that this has helped a lot & she requests that I refill this med for her- ok... ~  [facility]- otherw neg...    GYN >> she sees DrStringer for her GYN needs; she assures me that she is up to date & doing well...  RENAL CELL CANCER (ICD-189.0) - she had an incidental left renal mass found on [place]Abd [place] ([place] brother had DM and they were studying the siblings)...  5x6cm left lower pole mass found and removed by DrGrapey 10/05= renal cell cancer... she sees DrGrapey yearly now- last CTAbd in 2007 was OK... CXR & Labs here w/ copy to DrGrapey... ~  11/13: she had yearly f/u eval DrGrapey> s/p left radical nephrectomy 2005, no signs of recurrence or mets, CXR was reported clear...  ~  We do not have Urology note from 2014 in Epic system => pt reports that DrGrapey has released her 7 we are to check CXR & UA yearly...   DEGENERATIVE JOINT DISEASE (ICD-715.90) - she has had LBP, left hip pain, and shoulder discomfort... all Rx w/ MOBIC 7.576mPrn... ~  12/13: she saw TP w/ right side pain> feels like a catch, Rx w/ Mobic, heat, Skelaxin...   BACK PAIN, LUMBAR (ICD-724.2)  ANXIETY >> some stress w/ husb's open heart surg in 2012...   Health Maintenance - her GYN is DrStringer and she is s/p hysterectomy... [facility]... Her GI is DrNat-Mann & she had colonoscopy 5/13 as above...    Past Surgical History  Procedure Laterality Date  . Abdominal hysterectomy    . Left nephrectomy for renal cell cancer  2005    Dr. GrRisa Grill  Outpatient Encounter Prescriptions as of 06/30/2015  Medication Sig  . amLODipine (NORVASC) 10 MG tablet Take 1 tablet (10 mg total) by mouth daily.  . brimonidine (ALPHAGAN P) 0.1 % SOLN Place 1 drop into both eyes 2 (two) times daily.  . dorzolamide-timolol (COSOPT) 22.3-6.8 MG/ML ophthalmic solution Place 1 drop into both eyes 2 (two) times daily.  . furosemide (LASIX) 20 MG tablet Take 1 tablet (20 mg total) by mouth daily.  . Marland Kitchenevothyroxine (SYNTHROID, LEVOTHROID) 88 MCG tablet  TAKE 1 TABLET (88 MCG TOTAL) BY MOUTH DAILY BEFORE BREAKFAST.  . Marland Kitchenevothyroxine (SYNTHROID, LEVOTHROID) 88 MCG tablet TAKE 1 TABLET (88 MCG TOTAL) BY MOUTH DAILY BEFORE BREAKFAST.  . Marland KitchenINZESS 290 MCG CAPS capsule TAKE 1 CAPSULE BY MOUTH DAILY.  . Marland Kitchenosartan (COZAAR) 50 MG tablet TAKE 1 TABLET (50 MG TOTAL) BY MOUTH DAILY.  . meloxicam (MOBIC) 7.5 MG tablet Take 1 tablet (7.5 mg total) by mouth daily.  . methazolamide (NEPTAZANE) 50 MG tablet Take 50 mg by mouth 2 (two) times daily.  . potassium chloride SA (KLOR-CON M20) 20 MEQ tablet Take 1 tablet (20 mEq total) by mouth daily.  . simvastatin (ZOCOR) 20 MG tablet TAKE 1 TABLET (20 MG TOTAL) BY MOUTH EVERY EVENING.  . TRAVATAN Z 0.004 % SOLN ophthalmic solution Place 1 drop into both eyes At bedtime.   No facility-administered encounter medications on file as of 06/30/2015.    Allergies  Allergen Reactions  . Lisinopril     angioedema  . Morphine     REACTION: ITCHING  .  Sulfonamide Derivatives     REACTION: BURNING, IRRITATION    Current Medications, Allergies, Past Medical History, Past Surgical History, Family History, and Social History were reviewed in [employer] record.   Review of Systems    See HPI - all other systems neg except as noted... The patient denies anorexia, fever, weight loss, weight gain, vision loss, decreased hearing, hoarseness, chest pain, syncope, dyspnea on exertion, peripheral edema, prolonged cough, headaches, hemoptysis, abdominal pain, melena, hematochezia, severe indigestion/heartburn, hematuria, incontinence, muscle weakness, suspicious skin lesions, transient blindness, difficulty walking, depression, unusual weight change, abnormal bleeding, enlarged lymph nodes, and angioedema.     Objective:   Physical Exam      WD, WN, 59 y/o BF in NAD... GENERAL:  Alert & oriented; pleasant & cooperative... HEENT:  [place]/AT, EOM-wnl, PERRLA, Glasses, EACs-clear, TMs-wnl, NOSE-clear,  THROAT-clear & wnl. NECK:  Supple w/ full ROM; no JVD; normal carotid impulses w/o bruits; palp thyroid w/ nodular feel; no lymphadenopathy. CHEST:  Clear to P & A; without wheezes/ rales/ or rhonchi heard... HEART:  Regular Rhythm; without murmurs/ rubs/ or gallops detected... ABDOMEN:  Soft & nontender; normal bowel sounds; no organomegaly or masses palpated... EXT: without deformities or arthritic changes; no varicose veins/ venous insuffic/ or edema. NEURO:  CN's intact; motor testing normal; sensory testing normal; gait normal & balance OK. DERM:   Neg- no rash, lesions, etc...  RADIOLOGY DATA:  Reviewed in the EPIC EMR & discussed w/ the patient...  LABORATORY DATA:  Reviewed in the EPIC EMR & discussed w/ the patient...   Assessment:     HBP>  Controlled on her 3 meds; she is intol to ACE rx; & had Bradycardia from BBlocker=> we switched to ARB + Amlod & Lasix w/ good control...  Ven Insuffic>  Aware, she knows to elim sodium, elev legs, wear support hose, & take the Lasix...  CHOL>  on Simva20 + diet; parameters are not as good this yr & asked to get on better diet, get wt down etc..  Multinod Thyroid>  On Synthroid 18mg/d & TSH is improved, pt feels better as well...  Overweight>  Weight is down 10# further to 150# & we reviewed diet/ exercise/ wt reduction strategies...  GI> GERD, IBS w/ Constip>  [facility] & she asks usKoreao refill this Rx for her- ok...  Hx Renal Cell Ca w/ left nephrectomy 2005>  Prev followed by DrGrapey- he released her & we check a CXR & UA yearly...  Mild DJD, LBP>  Aware, she uses OTC analgesics as needed; we discussed exercise program...     Plan:     Patient's Medications  New Prescriptions   No medications on file  Previous Medications   AMLODIPINE (NORVASC) 10 MG TABLET    Take 1 tablet (10 mg total) by mouth daily.   BRIMONIDINE (ALPHAGAN P) 0.1 % SOLN    Place 1 drop into both eyes 2 (two) times  daily.   DORZOLAMIDE-TIMOLOL (COSOPT) 22.3-6.8 MG/ML OPHTHALMIC SOLUTION    Place 1 drop into both eyes 2 (two) times daily.   FUROSEMIDE (LASIX) 20 MG TABLET    Take 1 tablet (20 mg total) by mouth daily.   LEVOTHYROXINE (SYNTHROID, LEVOTHROID) 88 MCG TABLET    TAKE 1 TABLET (88 MCG TOTAL) BY MOUTH DAILY BEFORE BREAKFAST.   LEVOTHYROXINE (SYNTHROID, LEVOTHROID) 88 MCG TABLET    TAKE 1 TABLET (88 MCG TOTAL) BY MOUTH DAILY BEFORE BREAKFAST.   LIRolan Lipa  Phelps.   LOSARTAN (COZAAR) 50 MG TABLET    TAKE 1 TABLET (50 MG TOTAL) BY MOUTH DAILY.   MELOXICAM (MOBIC) 7.5 MG TABLET    Take 1 tablet (7.5 mg total) by mouth daily.   METHAZOLAMIDE (NEPTAZANE) 50 MG TABLET    Take 50 mg by mouth 2 (two) times daily.   POTASSIUM CHLORIDE SA (KLOR-CON M20) 20 MEQ TABLET    Take 1 tablet (20 mEq total) by mouth daily.   SIMVASTATIN (ZOCOR) 20 MG TABLET    TAKE 1 TABLET (20 MG TOTAL) BY MOUTH EVERY EVENING.   TRAVATAN Z 0.004 % SOLN OPHTHALMIC SOLUTION    Place 1 drop into both eyes At bedtime.  Modified Medications   No medications on file  Discontinued Medications   No medications on file

## 2015-06-30 NOTE — Patient Instructions (Signed)
Savannah Hubbard- it was great seeing you again... We refilled your meds per request...  Keep up the good work w/ diet & exercise...  Today we did your follow up CXR & FASTING blood work...    We will contact you w/ the results when available...   Call for any questions...  Let's plan a follow up visit in 23yr, sooner if needed for problems.Marland KitchenMarland Kitchen

## 2015-07-04 ENCOUNTER — Telehealth: Payer: Self-pay | Admitting: Pulmonary Disease

## 2015-07-04 NOTE — Telephone Encounter (Signed)
[phone], pt cb

## 2015-07-04 NOTE — Telephone Encounter (Signed)
Notes Recorded by Levander Campion, LPN on 12/02/4942 at 9:67 PM Left message for pt to call back to discuss results Notes Recorded by Noralee Space, MD on 07/01/2015 at 3:04 PM Please notify patient> Labs look great!!! FLP- all parameters are wnl on the simva20- rec to continue same... Chems, LFTs, CBC, Thyroid> all WNL.Marland KitchenMarland Kitchen Urine is clear, no blood etc...   -----  Result Notes     Notes Recorded by Levander Campion, LPN on 04/03/1637 at 4:66 PM Left message for pt to call back to discuss results ------  Notes Recorded by Noralee Space, MD on 07/01/2015 at 3:05 PM Please notify patient>  CXR is clear 7 wnl...   ------   lmomtcb for pt - Apolonio Schneiders, were you needing to speak with pt regarding anything else other than results?

## 2015-07-05 NOTE — Telephone Encounter (Signed)
Spoke to pt about lab and cxr results. Pt voiced understanding of results. Nothing further is needed

## 2015-08-12 ENCOUNTER — Other Ambulatory Visit: Payer: Self-pay | Admitting: Pulmonary Disease

## 2015-08-13 ENCOUNTER — Other Ambulatory Visit: Payer: Self-pay | Admitting: Pulmonary Disease

## 2015-08-31 ENCOUNTER — Other Ambulatory Visit: Payer: Self-pay | Admitting: Pulmonary Disease

## 2015-09-15 ENCOUNTER — Other Ambulatory Visit: Payer: Self-pay | Admitting: Pulmonary Disease

## 2015-09-16 ENCOUNTER — Telehealth: Payer: Self-pay | Admitting: Pulmonary Disease

## 2015-09-16 NOTE — Telephone Encounter (Signed)
Per SN, TSH has been reviewed.  Make sure pt is taking 4mcg per day of Synthroid  Ask pt to mail or fax previous TSH labs to office for review  Called and spoke with pt and informed of the above rec Pt voiced understanding and stated she was taking correct dose of synthroid  Pt stated she would fax labs to office for review  Nothing further is needed at this time

## 2015-09-16 NOTE — Telephone Encounter (Signed)
Pt says she had bloodwork done.  She said her thyroid numbers came back a little concerning.  Needs to know if medications need to be adjusted.  TSH: 0.258; T4: 13.3; T3: 25  SN - pls advise.

## 2015-10-01 ENCOUNTER — Other Ambulatory Visit: Payer: Self-pay | Admitting: Pulmonary Disease

## 2015-10-31 ENCOUNTER — Encounter: Payer: Self-pay | Admitting: Pulmonary Disease

## 2015-12-11 ENCOUNTER — Other Ambulatory Visit: Payer: Self-pay | Admitting: Pulmonary Disease

## 2016-05-16 ENCOUNTER — Encounter: Payer: Self-pay | Admitting: Pulmonary Disease

## 2016-05-16 ENCOUNTER — Other Ambulatory Visit (INDEPENDENT_AMBULATORY_CARE_PROVIDER_SITE_OTHER): Payer: BLUE CROSS/BLUE SHIELD

## 2016-05-16 ENCOUNTER — Other Ambulatory Visit: Payer: Self-pay

## 2016-05-16 ENCOUNTER — Ambulatory Visit (INDEPENDENT_AMBULATORY_CARE_PROVIDER_SITE_OTHER): Payer: BLUE CROSS/BLUE SHIELD | Admitting: Pulmonary Disease

## 2016-05-16 ENCOUNTER — Ambulatory Visit (INDEPENDENT_AMBULATORY_CARE_PROVIDER_SITE_OTHER)
Admission: RE | Admit: 2016-05-16 | Discharge: 2016-05-16 | Disposition: A | Discharge status: Home / Self Care | Payer: BLUE CROSS/BLUE SHIELD | Source: Ambulatory Visit | Attending: Pulmonary Disease | Admitting: Pulmonary Disease

## 2016-05-16 VITALS — BP 134/74 | HR 60 | Ht 62.0 in | Wt 160.6 lb

## 2016-05-16 DIAGNOSIS — K5909 Other constipation: Secondary | ICD-10-CM

## 2016-05-16 DIAGNOSIS — I872 Venous insufficiency (chronic) (peripheral): Secondary | ICD-10-CM

## 2016-05-16 DIAGNOSIS — M150 Primary generalized (osteo)arthritis: Secondary | ICD-10-CM

## 2016-05-16 DIAGNOSIS — I10 Essential (primary) hypertension: Secondary | ICD-10-CM

## 2016-05-16 DIAGNOSIS — C642 Malignant neoplasm of left kidney, except renal pelvis: Secondary | ICD-10-CM

## 2016-05-16 DIAGNOSIS — Z0000 Encounter for general adult medical examination without abnormal findings: Secondary | ICD-10-CM

## 2016-05-16 DIAGNOSIS — E7800 Pure hypercholesterolemia, unspecified: Secondary | ICD-10-CM

## 2016-05-16 DIAGNOSIS — M159 Polyosteoarthritis, unspecified: Secondary | ICD-10-CM

## 2016-05-16 DIAGNOSIS — E042 Nontoxic multinodular goiter: Secondary | ICD-10-CM

## 2016-05-16 LAB — URINALYSIS, ROUTINE W REFLEX MICROSCOPIC
BILIRUBIN URINE: NEGATIVE
HGB URINE DIPSTICK: NEGATIVE
Ketones, ur: NEGATIVE
NITRITE: NEGATIVE
RBC / HPF: NONE SEEN (ref 0–?)
Specific Gravity, Urine: 1.01 (ref 1.000–1.030)
TOTAL PROTEIN, URINE-UPE24: NEGATIVE
URINE GLUCOSE: NEGATIVE
Urobilinogen, UA: 0.2 (ref 0.0–1.0)
pH: 8 (ref 5.0–8.0)

## 2016-05-16 LAB — CBC WITH DIFFERENTIAL/PLATELET
BASOS ABS: 0 10*3/uL (ref 0.0–0.1)
Basophils Relative: 0.5 % (ref 0.0–3.0)
EOS ABS: 0.3 10*3/uL (ref 0.0–0.7)
Eosinophils Relative: 3.7 % (ref 0.0–5.0)
HCT: 41.4 % (ref 36.0–46.0)
HEMOGLOBIN: 14.1 g/dL (ref 12.0–15.0)
Lymphocytes Relative: 25.1 % (ref 12.0–46.0)
Lymphs Abs: 1.8 10*3/uL (ref 0.7–4.0)
MCHC: 34.1 g/dL (ref 30.0–36.0)
MCV: 91.9 fl (ref 78.0–100.0)
MONO ABS: 0.5 10*3/uL (ref 0.1–1.0)
Monocytes Relative: 7.4 % (ref 3.0–12.0)
Neutro Abs: 4.5 10*3/uL (ref 1.4–7.7)
Neutrophils Relative %: 63.3 % (ref 43.0–77.0)
Platelets: 238 10*3/uL (ref 150.0–400.0)
RBC: 4.5 Mil/uL (ref 3.87–5.11)
RDW: 12.9 % (ref 11.5–15.5)
WBC: 7.1 10*3/uL (ref 4.0–10.5)

## 2016-05-16 LAB — COMPREHENSIVE METABOLIC PANEL
ALBUMIN: 4 g/dL (ref 3.5–5.2)
ALT: 8 U/L (ref 0–35)
AST: 15 U/L (ref 0–37)
Alkaline Phosphatase: 88 U/L (ref 39–117)
BUN: 14 mg/dL (ref 6–23)
CALCIUM: 9.5 mg/dL (ref 8.4–10.5)
CHLORIDE: 106 meq/L (ref 96–112)
CO2: 27 meq/L (ref 19–32)
Creatinine, Ser: 0.96 mg/dL (ref 0.40–1.20)
GFR: 76.77 mL/min (ref >60.00)
Glucose, Bld: 97 mg/dL (ref 70–99)
POTASSIUM: 4 meq/L (ref 3.5–5.1)
Sodium: 140 mEq/L (ref 135–145)
Total Bilirubin: 0.4 mg/dL (ref 0.2–1.2)
Total Protein: 7.1 g/dL (ref 6.0–8.3)

## 2016-05-16 LAB — VITAMIN D 25 HYDROXY (VIT D DEFICIENCY, FRACTURES): VITD: 44.12 ng/mL (ref 30.00–100.00)

## 2016-05-16 LAB — LIPID PANEL
CHOLESTEROL: 149 mg/dL (ref 0–200)
HDL: 47.8 mg/dL (ref >39.00)
LDL CALC: 80 mg/dL (ref 0–99)
NonHDL: 101.19
TRIGLYCERIDES: 107 mg/dL (ref 0.0–149.0)
Total CHOL/HDL Ratio: 3
VLDL: 21.4 mg/dL (ref 0.0–40.0)

## 2016-05-16 LAB — TSH: TSH: 0.53 u[IU]/mL (ref 0.35–4.50)

## 2016-05-16 MED ORDER — FUROSEMIDE 20 MG PO TABS: ORAL_TABLET | ORAL | Status: DC

## 2016-05-16 MED ORDER — LOSARTAN POTASSIUM 50 MG PO TABS: ORAL_TABLET | ORAL | Status: DC

## 2016-05-16 MED ORDER — AMLODIPINE BESYLATE 10 MG PO TABS: ORAL_TABLET | ORAL | Status: DC

## 2016-05-16 MED ORDER — LEVOTHYROXINE SODIUM 88 MCG PO TABS: ORAL_TABLET | ORAL | Status: DC

## 2016-05-16 MED ORDER — SIMVASTATIN 20 MG PO TABS: ORAL_TABLET | ORAL | Status: DC

## 2016-05-16 MED ORDER — POTASSIUM CHLORIDE CRYS ER 20 MEQ PO TBCR: 20.0000 meq | EXTENDED_RELEASE_TABLET | Freq: Every day | ORAL | Status: DC

## 2016-05-16 MED ORDER — LINACLOTIDE 290 MCG PO CAPS: 290.0000 ug | ORAL_CAPSULE | Freq: Every day | ORAL | Status: DC

## 2016-05-16 NOTE — Patient Instructions (Signed)
Today we updated your med list in our EPIC system...    Continue your current medications the same...    We wrote refills of your meds per request...  Today we rechecked your CXR & FASTING blood work...    We will contact you w/ the results when available...   Let''s get on track w/ our diet & exercise program...  Call for any questions...  Let's plan a follow up visit in 49yr, sooner if needed for problems.Marland KitchenMarland Kitchen

## 2016-05-16 NOTE — Progress Notes (Signed)
Subjective:     Patient ID: Savannah Hubbard, female   DOB: 04/15/1956, 60 y.o.   MRN: [id number]  HPI 60 y/o BF here for a follow up visit... she has multiple medical problems as listed below>> ~  SEE PREV EPIC NOTES FOR OLDER DATA >>   ~  Apr 02, 2012:  8moRSalungafeels well overall; recently increased exercise & having some discomfort in her right side, worse w/ movement; exam is neg & she is advised to back off slightly, use heat/ myoflex, etc... She also notes that Pharm didn't change her Synthroid from 829m/d to 7534md until her most recent refill last month?!* I indicated she was due for FASTING blood work & we will take this into account & rec continuing the 90m2mose for now...  We reviewed her Problems, Meds, XRays, & Labs> see prob list below>>  LABS 5/13:  FLP- wnl on Simva20;  Chem- wnl;  CBC- wnl;  TSH=0.85;  UA- clear...  ~  May 19, 2013:  35mo71mo& [name] is upset about her weight (difficulty losing weight), notes thinning hair, & fatigue- she wonders about her thyroid (currently on Synthroid75 w/ last TSH=0.85); follow up fasting blood work is pending & she is reassured that we will recheck her definitive TFT w/ this blood work;  We reviewed the following medical problems during today's office visit >>     Glaucoma> on 3 drops per Ophthalmology...    HBP> on Metoprolol50Bid, Amlod10, Lasix20; BP= 118/72 & she denies HA, CP, palpit, SOB, edema, etc...    Ven Insuffic> she knows to elim sodium, elev legs, wear support hose, on Lasix20...    CHOL> on Simva20; last FLP 5/13 showed TChol 126, TG 84, HDL 45, LDL 64; she will ret for fasting labs=> TChol 198, TG 83, HDL 60, LDL 121 (rec- better diet, same med for now).    Multinod Thyroid> on Synthroid75; she is c/o fatigue, thinning hair, wt gain; Labs 5/13 showed TSH= 0.85; she will ret for fasting blood work=> TSH=4.07 (OK to incr back to 88mcg60m    Overweight> weight= 173# which is up 7# in the last yr; we reviewed diet,  exercise, wt reduction strategies...    GI- GERD, IBS, Constip, Hems> on Linzess290; [facility], she had colonoscopy 5/13 showing hems only...    Hx RenalCellCa> followed by DrGrapey- s/p left radical nephrectomy 2005, last seen 11/13- stable w/o signs of recurrence or mets; Creat stable at 1.1    DJD, LBP> she saw TP 12/13 w/ right back pain- treated w/ Mobic, Skelaxin and improved...  We reviewed prob list, meds, xrays and labs> see below for updates >>   LABS 6/14:  FLP- fair w/ incr LDL to 121 on same meds;  Chems- ok x BS=108;  CBC-wnl;  TSH=4.07;  UA- clear... Due to her symptoms and complaints- rec to pt to incr Synthroid back to 88mcg/45mse & recheck TSH in 3 mo...  ~  June 22, 2014:  35mo RO59moecheck mult medical problems> Arista Maimunased that she has lost 11# down to 161# today; she states that RN at work noted slow heart rate in 40's & suggested f/u here...      Glaucoma> she reports Laser surg by DrJBond,Nani Ravens/14; on 3 drops & Methazolamide50Bid w/ improved ocular pressures by her hx...     HBP> on Metoprolol50Bid, Amlod10, Lasix20; BP= 120/78 but pulse in 40's; she denies HA, CP, palpit, SOB,  edema, etc; states her energy is good; EKG w/ SBrady, rate 44, otherw wnl; Rec to stop BBlocker & change it to [facility]...     Ven Insuffic> she knows to elim sodium, elev legs, wear support hose, on Lasix20...    CHOL> on Simva20; last FLP 7/15 showed TChol 135, TG 79, HDL 44, LDL 75; rec better diet & continue same med...    Multinod Thyroid> on Synthroid88; she is feeling some better overall & f/u TSH 10/14 was 1.02; Labs 7/15 showed TSH=     Overweight> weight= 161# which is down 11# in the last yr; we reviewed diet, exercise, wt reduction strategies...    GI- GERD, IBS, Constip, Hems> on Linzess290; followed by [facility] for [facility], she had colonoscopy 5/13 showing hems only...    Hx RenalCellCa> followed by DrGrapey- s/p left radical nephrectomy 2005, last seen  11/13- stable w/o signs of recurrence or mets; Creat stable at 1.1    DJD, LBP> she saw TP 12/13 w/ right back pain- treated w/ Mobic, Skelaxin and improved...  We reviewed prob list, meds, xrays and labs> see below for updates >>   EKG 7/15 showed SBrady, rate44, otherw wnl; we decided to stop Metoprolol & switch to Losartan50...  LABS 7/15: FLP- at goals on Simva20, continue same;  Chems- ok x K=3.3 rec to start K20/d;  CBC- wnl;  TSH=0.33 on Synthroid88 & she prefers this dose...  ~  June 30, 2015:  33yrRStorla.. AShatikareports a good yr- doing well on diet & exercise program w/ wt down 10# to 150# today... We reviewed the following medical problems during today's office visit >>     Glaucoma> she reports Laser surg by DrJBond, [facility] 12/14; on 3 drops & Methazolamide50Bid w/ improved ocular pressures by her hx...     HBP> on Amlod10, Losar50, Lasix20; BP= 116/80 w/ pulse in 58; she denies HA, CP, palpit, SOB, edema, etc; states her energy is good off the BBlocker...    Ven Insuffic> she knows to elim sodium, elev legs, wear support hose, on Lasix20...    CHOL> on Simva20; FLP 8/16 showed TChol 145, TG 71, HDL 50, LDL 81; rec continue same med...    Multinod Thyroid> on Synthroid88; she is feeling well & f/u TSH 8/16 = 0.82    Overweight> improved! Wt is down 10# further to 150#; we reviewed diet, exercise, wt reduction strategies...    GI- GERD, IBS, Constip, Hems> on Linzess290; [facility] she had colonoscopy 5/13 showing hems only...    Hx RenalCellCa> followed by DrGrapey- s/p left radical nephrectomy 2005, last seen 11/13 & he released her- stable w/o signs of recurrence or mets; Creat stable at 1.1    DJD, LBP> she saw TP 12/13 w/ right back pain- treated w/ Mobic, Skelaxin and improved...  We reviewed prob list, meds, xrays and labs> see below for updates >>   CXR 06/30/15 showed norm heart size, clear lungs, NAD...  LABS 8/16:  FLP- at goals on Simva20;  Chems-  wnl;  CBC- wnl;  TSH=0.82;  UA- clear, no blood...  ~  May 16, 2016:  10-126moOV & medical f/u visit/ CPX>  AnSerenaheturns for CPX & reports a good interval w/o new complaints or concerns- she does mention intermittent chest discomfort but says this is stress related;  We reviewed the following medical problems during today's office visit >>     Glaucoma> she reports Laser surg by DrNani RavensWFPhoebe Perch2/14;  on 3 drops & Methazolamide50Bid w/ improved ocular pressures by her hx...     HBP> on Amlod10, Losar50, Lasix20, K20/d; BP= 134/74 w/ pulse 60; she denies HA, CP, palpit, SOB, edema, etc; states her energy is good off the BBlocker...    Ven Insuffic> she knows to elim sodium, elev legs, wear support hose, on Lasix20...    CHOL> on Simva20; FLP 6/17 showed TChol 149, TG 107, HDL 48, LDL 80; rec continue same med...    Multinod Thyroid> on Synthroid88; she is feeling well & f/u TSH 6/17 = 0.53    Overweight> improved! Wt is stable at 160#; we reviewed diet, exercise, wt reduction strategies...    GI- GERD, IBS, Constip, Hems> on Linzess290; followed by [facility] for GI, she had colonoscopy 5/13 showing hems only...    GYN- DrStringer for PAPs and mammograms...    Hx RenalCellCa> prev followed by DrGrapey- s/p left radical nephrectomy 2005, last seen 11/13 & he released her- stable w/o signs of recurrence or mets; Creat stable at 1.1, UA clear, CXR- neg...    DJD, LBP> she saw TP 12/13 w/ right back pain- treated w/ Mobic, Skelaxin and improved...     Depression- sister Cornelia Marijo File has passed away EXAM shows Afeb, VSS, O2sat=100% on RA;  HEENT- neg, mallampati2;  Chest- clear w/o w/r/r;  Heart- RR w/o m/r/g;  Abd- soft, nontender, neg;  Ext- neg w/o c/c/e;  Neuro- intact...   CXR 05/16/16>  Borderline heart size, clear lungs- NAD...  LABS 05/16/16>  FLP- at goals on Simva20;  Chems- wnl w/ BS=97, Cr=0.96, LFTs wnl;  CBC- wnl w/ Hg=14.1;  TSH=0.53;  VitD=44;  UA- clear IMP/PLAN>>  Zada remains  stable, doing well on current meds- continue same;  We reviewed diet + exercise prescriptions;             PROBLEM LIST:       HYPERTENSION (ICD-401.9) >>  ~  Baseline EKG 2009 showed SBrady, rate56, wnl, NAD...  ~  on METOPROLOL 61mBid,  AMLODIPNE 143md & LASIX 2024m... she is ACE intolerant...  ~  3/12:  BP= 136/88 and she reports 120's/ 70's at home... denies HA, fatigue, visual changes, CP, palipit, dizziness, syncope, dyspnea, edema, etc... since she has only one kidney we discussed better control needed (Bisoprolol changed to Metoprolol)... ~  5/13:  BP= 120/84 & similar at home; she remains asymptomatic & stable on these 3 meds... ~  CXR 11/13 showed norm heart size, clear lungs, NAD...  ~  6/14: on Metoprolol50Bid, Amlod10, Lasix20; BP= 118/72 & she denies HA, CP, palpit, SOB, edema, etc... ~  7/15: on Metoprolol50Bid, Amlod10, Lasix20; BP= 120/78 but pulse in 40's; she denies HA, CP, palpit, SOB, edema, etc; states her energy is good; EKG w/ SBrady, rate 44, otherw wnl; [facility] ~  8/16: on Amlod10, Losar50, Lasix20; BP= 116/80 w/ pulse in 58; she denies HA, CP, palpit, SOB, edema, etc; states her energy is good off the BBlocker.  VENOUS INSUFFICIENCY (ICD-459.81) - she has mild VI and knows to avoid sodium, elevate legs, wear support hose, & take the Lasix Qam...  HYPERCHOLESTEROLEMIA (ICD-272.0) - on SIMVASTATIN 77m27mstarted 8/09... ~  FLP [place]7 showed TChol 187, TG 108, HDL 38, LDL 128... she prefered diet alone. ~  FLP [place]9 showed TChol 187, TG 109, HDL 35, LDL 130... rec to start Simvastatin20. ~  [facility] TChol 153, TG 70, HDL 47, LDL 92... rec>  continue Simva20... ~  Labs 3/12 on Simva20 showed TChol 161, TG 148, HDL 43, LDL 89 ~  Labs 5/13 on Simva20 showed TChol 126, TG 84, HDL 45, LDL 64 ~  Labs 6/14 on Simva20 showed TChol 198, TG 83, HDL 60, LDL 121 (what happened? rec same med, better diet, get wt down). ~  Labs  7/15 on Simva20 showed TChol 135, TG 79, HDL 44, LDL 75 ~  Labs 8/16 on Simva20 showed TChol 145, TG 71, HDL 50, LDL 81  NONTOXIC MULTINODULAR GOITER (ICD-241.1) - on SYNTHROID 35mg/d (?decreased from 88 to 75 in 2012 but pharm didn't execute this order til 2013?)... hx palp thyroid on exam w/ eval in 2005 showing inhomogeneous uptake bilat w/ sonar showing at least 6 sm nodules- mixed solid & cystic lesions c/w multinodular gland... Synthroid suppression started and dose adjusted... ~  labs 7/08 showed TSH= 0.16 ~  labs 8/09 showed TSH= 0.27 ~  [facility] T[facility]= 0.42, T4= 11.1, T3uptake= 29, FTI= 3.2..[name] rec> continue same dose. ~  Labs 3/12 on Levothy88 showed TSH= 0.18 and dose decr from 870m/d to 752md... ~  Labs 5/13 on Levothy75 for a few weeks showed TSH=0.85... We decided to continue the 38m52m dose... ~  6/14: she is c/o fatigue, thinning hair, wt gain; Labs on Synthroid75 showed TSH= 4.07 & she is rec to incr Synthroid back to 88mc71m.. ~  Labs 10/14 on Synthroid88 showed TSH= 1.02 ~  Labs 7/15 on Synthroid88 showed TSH= 0.33 ~  Labs 8/16 on Synthroid88 showed TSH= 0.82  OVERWEIGHT (ICD-278.02) - we reviewed diet, exercise, wt reduction strategies... ~  Weight 3/12 = 170# ~  Weight 5/13 = 166# ~  Weight 6/14 = 173# ~  Weight 7/15 = 161#... great job on diet, exercise, wt reduction... ~  Weight 8/16 = 150#  GERD (ICD-530.81) - min reflux symptoms intermittently- OTC Rx as needed.  IRRITABLE BOWEL SYNDROME & CONSTIPATION >> she had a neg colonoscopy in 1989 by DrPatterson ([facility])... ~  5/13:  She tells me that she [facility] from her GYN, DrStringer; she has Constipation predom IBS & was in the habit of using a suppos every day to go; [facility]Mann [facility] reports that this has helped a lot & she requests that I refill this med for her- ok... ~  [facility] large int hems- otherw neg...   GYN >> she sees DrStringer for her GYN needs; she assures me that she is up to date & doing well...  RENAL CELL CANCER (ICD-189.0) - she had an incidental left renal mass found on CTAbd done as part of [facility] (her brother had DM and they were studying the siblings)...  5x6cm left lower pole mass found and removed by DrGrapey 10/05= renal cell cancer... she sees DrGrapey yearly now- last CTAbd in 2007 was OK... CXR & Labs here w/ copy to DrGrapey... ~  11/13: she had yearly f/u eval DrGrapey> s/p left radical nephrectomy 2005, no signs of recurrence or mets, CXR was reported clear...  ~  We do not have Urology note from 2014 in Epic system => pt reports that DrGrapey has released her 7 we are to check CXR & UA yearly...   DEGENERATIVE JOINT DISEASE (ICD-715.90) - she has had LBP, left hip pain, and shoulder discomfort... all Rx w/ MOBIC 7.5mg55m  Prn... ~  12/13: she saw TP w/ right side pain> feels like a catch, Rx w/ Mobic, heat, Skelaxin...   BACK PAIN, LUMBAR (ICD-724.2)  ANXIETY >> some stress w/ husb's open heart surg in 2012...   Health Maintenance - her GYN is DrStringer and she is s/p hysterectomy... she [facility] and is up-to-date... Her GI is DrNat-Mann & she had colonoscopy 5/13 as above...    Past Surgical History  Procedure Laterality Date  . Abdominal hysterectomy    . Left nephrectomy for renal cell cancer  2005    Dr. Risa Grill    Outpatient Encounter Prescriptions as of 05/16/2016  Medication Sig  . brimonidine (ALPHAGAN P) 0.1 % SOLN Place 1 drop into both eyes 2 (two) times daily.  . dorzolamide-timolol (COSOPT) 22.3-6.8 MG/ML ophthalmic solution Place 1 drop into both eyes 2 (two) times daily.  . methazolamide (NEPTAZANE) 50 MG tablet Take 50 mg by mouth 2 (two) times daily.  . TRAVATAN Z 0.004 % SOLN ophthalmic solution Place 1 drop into both eyes At bedtime.  .  [DISCONTINUED] amLODipine (NORVASC) 10 MG tablet TAKE 1 TABLET (10 MG TOTAL) BY MOUTH DAILY.  . [DISCONTINUED] furosemide (LASIX) 20 MG tablet TAKE 1 TABLET (20 MG TOTAL) BY MOUTH DAILY.  . [DISCONTINUED] levothyroxine (SYNTHROID, LEVOTHROID) 88 MCG tablet TAKE 1 TABLET (88 MCG TOTAL) BY MOUTH DAILY BEFORE BREAKFAST.  . [DISCONTINUED] LINZESS 290 MCG CAPS capsule TAKE 1 CAPSULE BY MOUTH DAILY.  . [DISCONTINUED] losartan (COZAAR) 50 MG tablet TAKE 1 TABLET (50 MG TOTAL) BY MOUTH DAILY.  . [DISCONTINUED] potassium chloride SA (KLOR-CON M20) 20 MEQ tablet Take 1 tablet (20 mEq total) by mouth daily.  . [DISCONTINUED] simvastatin (ZOCOR) 20 MG tablet TAKE 1 TABLET (20 MG TOTAL) BY MOUTH EVERY EVENING.  . [DISCONTINUED] levothyroxine (SYNTHROID, LEVOTHROID) 88 MCG tablet TAKE 1 TABLET (88 MCG TOTAL) BY MOUTH DAILY BEFORE BREAKFAST. (Patient not taking: Reported on 05/16/2016)  . [DISCONTINUED] meloxicam (MOBIC) 7.5 MG tablet Take 1 tablet (7.5 mg total) by mouth daily. (Patient not taking: Reported on 05/16/2016)   No facility-administered encounter medications on file as of 05/16/2016.    Allergies  Allergen Reactions  . Lisinopril     angioedema  . Morphine     REACTION: ITCHING  . Sulfonamide Derivatives     REACTION: BURNING, IRRITATION    Current Medications, Allergies, Past Medical History, Past Surgical History, Family History, and Social History were reviewed in [employer] record.   Review of Systems    See HPI - all other systems neg except as noted... The patient denies anorexia, fever, weight loss, weight gain, vision loss, decreased hearing, hoarseness, chest pain, syncope, dyspnea on exertion, peripheral edema, prolonged cough, headaches, hemoptysis, abdominal pain, melena, hematochezia, severe indigestion/heartburn, hematuria, incontinence, muscle weakness, suspicious skin lesions, transient blindness, difficulty walking, depression, unusual weight change,  abnormal bleeding, enlarged lymph nodes, and angioedema.     Objective:   Physical Exam      WD, WN, 60 y/o BF in NAD... GENERAL:  Alert & oriented; pleasant & cooperative... HEENT:  [place]/AT, EOM-wnl, PERRLA, Glasses, EACs-clear, TMs-wnl, NOSE-clear, THROAT-clear & wnl. NECK:  Supple w/ full ROM; no JVD; normal carotid impulses w/o bruits; palp thyroid w/ nodular feel; no lymphadenopathy. CHEST:  Clear to P & A; without wheezes/ rales/ or rhonchi heard... HEART:  Regular Rhythm; without murmurs/ rubs/ or gallops detected... ABDOMEN:  Soft & nontender; normal bowel sounds; no organomegaly or masses palpated.Marland KitchenMarland Kitchen  EXT: without deformities or arthritic changes; no varicose veins/ venous insuffic/ or edema. NEURO:  CN's intact; motor testing normal; sensory testing normal; gait normal & balance OK. DERM:   Neg- no rash, lesions, etc...  RADIOLOGY DATA:  Reviewed in the EPIC EMR & discussed w/ the patient...  LABORATORY DATA:  Reviewed in the EPIC EMR & discussed w/ the patient...   Assessment:     HBP>  Controlled on her 3 meds; she is intol to ACE rx; & had Bradycardia from BBlocker=> we switched to ARB + Amlod & Lasix w/ good control...  Ven Insuffic>  Aware, she knows to elim sodium, elev legs, wear support hose, & take the Lasix...  CHOL>  on Simva20 + diet; parameters are not as good this yr & asked to get on better diet, get wt down etc..  Multinod Thyroid>  On Synthroid 63mg/d & TSH is improved, pt feels better as well...  Overweight>  Weight is down 10# further to 150# & we reviewed diet/ exercise/ wt reduction strategies...  GI> GERD, IBS w/ Constip>  [facility] & she asks usKoreao refill this Rx for her- ok...  Hx Renal Cell Ca w/ left nephrectomy 2005>  Prev followed by DrGrapey- he released her & we check a CXR & UA yearly...  Mild DJD, LBP>  Aware, she uses OTC analgesics as needed; we discussed exercise program...     Plan:      Patient's Medications  New Prescriptions   No medications on file  Previous Medications   BRIMONIDINE (ALPHAGAN P) 0.1 % SOLN    Place 1 drop into both eyes 2 (two) times daily.   DORZOLAMIDE-TIMOLOL (COSOPT) 22.3-6.8 MG/ML OPHTHALMIC SOLUTION    Place 1 drop into both eyes 2 (two) times daily.   METHAZOLAMIDE (NEPTAZANE) 50 MG TABLET    Take 50 mg by mouth 2 (two) times daily.   TRAVATAN Z 0.004 % SOLN OPHTHALMIC SOLUTION    Place 1 drop into both eyes At bedtime.  Modified Medications   Modified Medication Previous Medication   AMLODIPINE (NORVASC) 10 MG TABLET amLODipine (NORVASC) 10 MG tablet      TAKE 1 TABLET (10 MG TOTAL) BY MOUTH DAILY.    TAKE 1 TABLET (10 MG TOTAL) BY MOUTH DAILY.   FUROSEMIDE (LASIX) 20 MG TABLET furosemide (LASIX) 20 MG tablet      TAKE 1 TABLET (20 MG TOTAL) BY MOUTH DAILY.    TAKE 1 TABLET (20 MG TOTAL) BY MOUTH DAILY.   LEVOTHYROXINE (SYNTHROID, LEVOTHROID) 88 MCG TABLET levothyroxine (SYNTHROID, LEVOTHROID) 88 MCG tablet      TAKE 1 TABLET (88 MCG TOTAL) BY MOUTH DAILY BEFORE BREAKFAST.    TAKE 1 TABLET (88 MCG TOTAL) BY MOUTH DAILY BEFORE BREAKFAST.   LINACLOTIDE (LINZESS) 290 MCG CAPS CAPSULE LINZESS 290 MCG CAPS capsule      Take 1 capsule (290 mcg total) by mouth daily.    TAKE 1 CAPSULE BY MOUTH DAILY.   LOSARTAN (COZAAR) 50 MG TABLET losartan (COZAAR) 50 MG tablet      TAKE 1 TABLET (50 MG TOTAL) BY MOUTH DAILY.    TAKE 1 TABLET (50 MG TOTAL) BY MOUTH DAILY.   POTASSIUM CHLORIDE SA (KLOR-CON M20) 20 MEQ TABLET potassium chloride SA (KLOR-CON M20) 20 MEQ tablet      Take 1 tablet (20 mEq total) by mouth daily.    Take 1 tablet (20 mEq total) by mouth daily.   SIMVASTATIN (  ZOCOR) 20 MG TABLET simvastatin (ZOCOR) 20 MG tablet      TAKE 1 TABLET (20 MG TOTAL) BY MOUTH EVERY EVENING.    TAKE 1 TABLET (20 MG TOTAL) BY MOUTH EVERY EVENING.  Discontinued Medications   LEVOTHYROXINE (SYNTHROID, LEVOTHROID) 88 MCG TABLET    TAKE 1 TABLET (88 MCG TOTAL) BY MOUTH  DAILY BEFORE BREAKFAST.   MELOXICAM (MOBIC) 7.5 MG TABLET    Take 1 tablet (7.5 mg total) by mouth daily.

## 2016-05-31 ENCOUNTER — Other Ambulatory Visit: Payer: Self-pay | Admitting: Obstetrics and Gynecology

## 2016-05-31 DIAGNOSIS — N644 Mastodynia: Secondary | ICD-10-CM

## 2016-06-04 ENCOUNTER — Ambulatory Visit
Admission: RE | Admit: 2016-06-04 | Discharge: 2016-06-04 | Disposition: A | Discharge status: Home / Self Care | Payer: BLUE CROSS/BLUE SHIELD | Source: Ambulatory Visit | Attending: Obstetrics and Gynecology | Admitting: Obstetrics and Gynecology

## 2016-06-04 DIAGNOSIS — N644 Mastodynia: Secondary | ICD-10-CM

## 2016-07-02 ENCOUNTER — Ambulatory Visit: Payer: BLUE CROSS/BLUE SHIELD | Admitting: Pulmonary Disease

## 2016-07-02 ENCOUNTER — Other Ambulatory Visit: Payer: Self-pay | Admitting: Pulmonary Disease

## 2016-10-12 ENCOUNTER — Other Ambulatory Visit: Payer: Self-pay | Admitting: Pulmonary Disease

## 2016-11-01 DIAGNOSIS — J069 Acute upper respiratory infection, unspecified: Secondary | ICD-10-CM | POA: Diagnosis not present

## 2016-12-05 DIAGNOSIS — I10 Essential (primary) hypertension: Secondary | ICD-10-CM | POA: Diagnosis not present

## 2016-12-05 DIAGNOSIS — E039 Hypothyroidism, unspecified: Secondary | ICD-10-CM | POA: Diagnosis not present

## 2016-12-05 DIAGNOSIS — E782 Mixed hyperlipidemia: Secondary | ICD-10-CM | POA: Diagnosis not present

## 2017-03-18 DIAGNOSIS — E782 Mixed hyperlipidemia: Secondary | ICD-10-CM | POA: Diagnosis not present

## 2017-03-18 DIAGNOSIS — E039 Hypothyroidism, unspecified: Secondary | ICD-10-CM | POA: Diagnosis not present

## 2017-03-18 DIAGNOSIS — J302 Other seasonal allergic rhinitis: Secondary | ICD-10-CM | POA: Diagnosis not present

## 2017-03-18 DIAGNOSIS — J3089 Other allergic rhinitis: Secondary | ICD-10-CM | POA: Diagnosis not present

## 2017-03-18 DIAGNOSIS — H00022 Hordeolum internum right lower eyelid: Secondary | ICD-10-CM | POA: Diagnosis not present

## 2017-03-22 DIAGNOSIS — H401133 Primary open-angle glaucoma, bilateral, severe stage: Secondary | ICD-10-CM | POA: Diagnosis not present

## 2017-03-25 DIAGNOSIS — H401133 Primary open-angle glaucoma, bilateral, severe stage: Secondary | ICD-10-CM | POA: Diagnosis not present

## 2017-04-05 DIAGNOSIS — H401133 Primary open-angle glaucoma, bilateral, severe stage: Secondary | ICD-10-CM | POA: Diagnosis not present

## 2017-05-11 ENCOUNTER — Other Ambulatory Visit: Payer: Self-pay | Admitting: Pulmonary Disease

## 2017-05-20 ENCOUNTER — Other Ambulatory Visit (INDEPENDENT_AMBULATORY_CARE_PROVIDER_SITE_OTHER): Payer: BLUE CROSS/BLUE SHIELD

## 2017-05-20 ENCOUNTER — Ambulatory Visit (INDEPENDENT_AMBULATORY_CARE_PROVIDER_SITE_OTHER): Payer: BLUE CROSS/BLUE SHIELD | Admitting: Pulmonary Disease

## 2017-05-20 ENCOUNTER — Ambulatory Visit (INDEPENDENT_AMBULATORY_CARE_PROVIDER_SITE_OTHER)
Admission: RE | Admit: 2017-05-20 | Discharge: 2017-05-20 | Disposition: A | Discharge status: Home / Self Care | Payer: BLUE CROSS/BLUE SHIELD | Source: Ambulatory Visit | Attending: Pulmonary Disease | Admitting: Pulmonary Disease

## 2017-05-20 VITALS — BP 128/80 | HR 61 | Temp 97.4°F | Ht 62.0 in | Wt 165.0 lb

## 2017-05-20 DIAGNOSIS — C642 Malignant neoplasm of left kidney, except renal pelvis: Secondary | ICD-10-CM

## 2017-05-20 DIAGNOSIS — I872 Venous insufficiency (chronic) (peripheral): Secondary | ICD-10-CM

## 2017-05-20 DIAGNOSIS — E7800 Pure hypercholesterolemia, unspecified: Secondary | ICD-10-CM

## 2017-05-20 DIAGNOSIS — I10 Essential (primary) hypertension: Secondary | ICD-10-CM | POA: Diagnosis not present

## 2017-05-20 DIAGNOSIS — E042 Nontoxic multinodular goiter: Secondary | ICD-10-CM

## 2017-05-20 DIAGNOSIS — Z0000 Encounter for general adult medical examination without abnormal findings: Secondary | ICD-10-CM | POA: Diagnosis not present

## 2017-05-20 DIAGNOSIS — Z905 Acquired absence of kidney: Secondary | ICD-10-CM

## 2017-05-20 DIAGNOSIS — M159 Polyosteoarthritis, unspecified: Secondary | ICD-10-CM

## 2017-05-20 DIAGNOSIS — M150 Primary generalized (osteo)arthritis: Secondary | ICD-10-CM

## 2017-05-20 LAB — COMPREHENSIVE METABOLIC PANEL
ALT: 10 U/L (ref 0–35)
AST: 16 U/L (ref 0–37)
Albumin: 4.3 g/dL (ref 3.5–5.2)
Alkaline Phosphatase: 84 U/L (ref 39–117)
BUN: 16 mg/dL (ref 6–23)
CALCIUM: 9.8 mg/dL (ref 8.4–10.5)
CHLORIDE: 105 meq/L (ref 96–112)
CO2: 28 meq/L (ref 19–32)
CREATININE: 1.17 mg/dL (ref 0.40–1.20)
GFR: 60.89 mL/min (ref >60.00)
Glucose, Bld: 105 mg/dL — ABNORMAL HIGH (ref 70–99)
Potassium: 4.3 mEq/L (ref 3.5–5.1)
SODIUM: 141 meq/L (ref 135–145)
Total Bilirubin: 0.4 mg/dL (ref 0.2–1.2)
Total Protein: 7.5 g/dL (ref 6.0–8.3)

## 2017-05-20 LAB — CBC WITH DIFFERENTIAL/PLATELET
BASOS PCT: 0.6 % (ref 0.0–3.0)
Basophils Absolute: 0 10*3/uL (ref 0.0–0.1)
EOS ABS: 0.3 10*3/uL (ref 0.0–0.7)
Eosinophils Relative: 3.7 % (ref 0.0–5.0)
HCT: 46.9 % — ABNORMAL HIGH (ref 36.0–46.0)
HEMOGLOBIN: 15.7 g/dL — AB (ref 12.0–15.0)
LYMPHS ABS: 1.5 10*3/uL (ref 0.7–4.0)
Lymphocytes Relative: 22.2 % (ref 12.0–46.0)
MCHC: 33.4 g/dL (ref 30.0–36.0)
MCV: 95.3 fl (ref 78.0–100.0)
MONO ABS: 0.5 10*3/uL (ref 0.1–1.0)
Monocytes Relative: 7.8 % (ref 3.0–12.0)
NEUTROS ABS: 4.5 10*3/uL (ref 1.4–7.7)
NEUTROS PCT: 65.7 % (ref 43.0–77.0)
PLATELETS: 236 10*3/uL (ref 150.0–400.0)
RBC: 4.92 Mil/uL (ref 3.87–5.11)
RDW: 12.8 % (ref 11.5–15.5)
WBC: 6.9 10*3/uL (ref 4.0–10.5)

## 2017-05-20 LAB — URINALYSIS
BILIRUBIN URINE: NEGATIVE
HGB URINE DIPSTICK: NEGATIVE
KETONES UR: NEGATIVE
Leukocytes, UA: NEGATIVE
Nitrite: NEGATIVE
Specific Gravity, Urine: 1.015 (ref 1.000–1.030)
TOTAL PROTEIN, URINE-UPE24: NEGATIVE
UROBILINOGEN UA: 0.2 (ref 0.0–1.0)
Urine Glucose: NEGATIVE
pH: 6.5 (ref 5.0–8.0)

## 2017-05-20 LAB — LIPID PANEL
CHOL/HDL RATIO: 4
Cholesterol: 184 mg/dL (ref 0–200)
HDL: 49.2 mg/dL (ref >39.00)
LDL CALC: 109 mg/dL — AB (ref 0–99)
NonHDL: 135.15
TRIGLYCERIDES: 131 mg/dL (ref 0.0–149.0)
VLDL: 26.2 mg/dL (ref 0.0–40.0)

## 2017-05-20 LAB — TSH: TSH: 0.56 u[IU]/mL (ref 0.35–4.50)

## 2017-05-20 MED ORDER — LINACLOTIDE 290 MCG PO CAPS: 290.0000 ug | ORAL_CAPSULE | Freq: Every day | ORAL | 3 refills | Status: DC

## 2017-05-20 MED ORDER — SIMVASTATIN 20 MG PO TABS: ORAL_TABLET | ORAL | 3 refills | Status: DC

## 2017-05-20 MED ORDER — AMLODIPINE BESYLATE 10 MG PO TABS: ORAL_TABLET | ORAL | 3 refills | Status: DC

## 2017-05-20 MED ORDER — LEVOTHYROXINE SODIUM 88 MCG PO TABS: ORAL_TABLET | ORAL | 3 refills | Status: DC

## 2017-05-20 MED ORDER — LOSARTAN POTASSIUM 50 MG PO TABS: ORAL_TABLET | ORAL | 3 refills | Status: DC

## 2017-05-20 MED ORDER — FUROSEMIDE 20 MG PO TABS: ORAL_TABLET | ORAL | 3 refills | Status: DC

## 2017-05-20 NOTE — Patient Instructions (Signed)
Today we updated your med list in our EPIC system...    Continue your current medications the same...  Today we did a follow up CXR & your FASTING blood work...    We will contact you w/ the results when available...   We will also arrange for a right renal ultrasound to be sure there are no lesions in your right kidney...  We gave you the TDAP tetanus vaccine today (good for 10 yrs)  Keep up the good work w/ DIET, EXERCISE, & wt reduction strategies...   Call for any questions...  Let's plan a follow up visit in 8yr, sooner if needed for problems.Marland KitchenMarland Kitchen

## 2017-05-21 ENCOUNTER — Encounter: Payer: Self-pay | Admitting: Pulmonary Disease

## 2017-05-21 NOTE — Progress Notes (Signed)
Subjective:     Patient ID: Savannah Hubbard, female   DOB: Apr 01, 1956, 61 y.o.   MRN: [id number]  HPI 61 y/o BF here for a follow up visit... she has multiple medical problems as listed below>> ~  SEE PREV EPIC NOTES FOR OLDER DATA >>     LABS 5/13:  FLP- wnl on Simva20;  Chem- wnl;  CBC- wnl;  TSH=0.85;  UA- clear...   ~  May 19, 2013:  67moROV & Savannah Hubbard is upset about her weight (difficulty losing weight), notes thinning hair, & fatigue- she wonders about her thyroid (currently on Synthroid75 w/ last TSH=0.85); follow up fasting blood work is pending & she is reassured that we will recheck her definitive TFT w/ this blood work;  We reviewed the following medical problems during today's office visit >>     Glaucoma> on 3 drops per Ophthalmology...    HBP> on Metoprolol50Bid, Amlod10, Lasix20; BP= 118/72 & she denies HA, CP, palpit, SOB, edema, etc...    Ven Insuffic> she knows to elim sodium, elev legs, wear support hose, on Lasix20...    CHOL> on Simva20; last FLP 5/13 showed TChol 126, TG 84, HDL 45, LDL 64; she will ret for fasting labs=> TChol 198, TG 83, HDL 60, LDL 121 (rec- better diet, same med for now).    Multinod Thyroid> on Synthroid75; she is c/o fatigue, thinning hair, wt gain; Labs 5/13 showed TSH= 0.85; she will ret for fasting blood work=> TSH=4.07 (OK to incr back to 826m/d).    Overweight> weight= 173# which is up 7# in the last yr; we reviewed diet, exercise, wt reduction strategies...    Hubbard- GERD, IBS, Constip, Hems> on Linzess290; followed by Savannah Hubbard, she had colonoscopy 5/13 showing hems only...    Hx RenalCellCa> followed by Savannah Hubbard- s/p left radical nephrectomy 2005, last seen 11/13- stable w/o signs of recurrence or mets; Creat stable at 1.1    DJD, LBP> she saw TP 12/13 w/ right back pain- treated w/ Mobic, Skelaxin and improved...  We reviewed prob list, meds, xrays and labs> see below for updates >>   LABS 6/14:  FLP- fair w/ incr LDL to 121 on  same meds;  Chems- ok x BS=108;  CBC-wnl;  TSH=4.07;  UA- clear... Due to her symptoms and complaints- rec to pt to incr Synthroid back to 8824md dose & recheck TSH in 3 mo...  ~  June 22, 2014:  59m27mo & recheck mult medical problems> [name] that she has lost 11# down to 161# today; she states that RN at work noted slow heart rate in 40's & suggested f/u here...      Glaucoma> she reports Laser surg by Savannah Hubbard 12/14; on 3 drops & Methazolamide50Bid w/ improved ocular pressures by her hx...     HBP> on Metoprolol50Bid, Amlod10, Lasix20; BP= 120/78 but pulse in 40's; she denies HA, CP, palpit, SOB, edema, etc; states her energy is good; EKG w/ SBrady, rate 44, otherw wnl; Rec to stop BBlocker & [facility]    Ven Insuffic> she knows to elim sodium, elev legs, wear support hose, on Lasix20...    CHOL> on Simva20; last FLP 7/15 showed TChol 135, TG 79, HDL 44, LDL 75; rec better diet & continue same med...    Multinod Thyroid> on Synthroid88; she is feeling some better overall & f/u TSH 10/14 was 1.02; Labs 7/15 showed TSH=     Overweight> weight= 161# which  is down 11# in the last yr; we reviewed diet, exercise, wt reduction strategies...    Hubbard- GERD, IBS, Constip, Hems> on Linzess290; followed by [facility] for Hubbard, she had colonoscopy 5/13 showing hems only...    Hx RenalCellCa> followed by Savannah Hubbard- s/p left radical nephrectomy 2005, last seen 11/13- stable w/o signs of recurrence or mets; Creat stable at 1.1    DJD, LBP> she saw TP 12/13 w/ right back pain- treated w/ Mobic, Skelaxin and improved...  We reviewed prob list, meds, xrays and labs> see below for updates >>   EKG 7/15 showed SBrady, rate44, otherw wnl; we decided to stop Metoprolol & switch to Losartan50...  LABS 7/15: FLP- at goals on Simva20, continue same;  Chems- ok x K=3.3 rec to start K20/d;  CBC- wnl;  TSH=0.33 on Synthroid88 & she prefers this dose...  ~  June 30, 2015:  38yr[place]..  AAndyreports a good yr- doing well on diet & exercise program w/ wt down 10# to 150# today... We reviewed the following medical problems during today's office visit >>     Glaucoma> she reports Laser surg by Savannah Hubbard, Savannah Hubbard 12/14; on 3 drops & Methazolamide50Bid w/ improved ocular pressures by her hx...     HBP> on Amlod10, Losar50, Lasix20; BP= 116/80 w/ pulse in 58; she denies HA, CP, palpit, SOB, edema, etc; states her energy is good off the BBlocker & pulse rate back to normal...    Ven Insuffic> she knows to elim sodium, elev legs, wear support hose, on Lasix20...    CHOL> on Simva20; FLP 8/16 showed TChol 145, TG 71, HDL 50, LDL 81; rec continue same med...    Multinod Thyroid> on Synthroid88; she is feeling well & f/u TSH 8/16 = 0.82    Overweight> improved! Wt is down 10# further to 150#; we reviewed diet, exercise, wt reduction strategies...    Hubbard- GERD, IBS, Constip, Hems> on Linzess290; [facility] she had colonoscopy 5/13 showing hems only...    Hx RenalCellCa> followed by Savannah Hubbard- s/p left radical nephrectomy 2005, last seen 11/13 & he released her- stable w/o signs of recurrence or mets; Creat stable at 1.1    DJD, LBP> she saw TP 12/13 w/ right back pain- treated w/ Mobic, Skelaxin and improved...  We reviewed prob list, meds, xrays and labs> see below for updates >>   CXR 06/30/15 showed norm heart size, clear lungs, NAD...  LABS 8/16:  FLP- at goals on Simva20;  Chems- wnl;  CBC- wnl;  TSH=0.82;  UA- clear, no blood...  ~  May 16, 2016:  10-186moOV & medical f/u visit/ CPX>  Savannah Hubbard for CPX & reports a good interval w/o new complaints or concerns- she does mention intermittent chest discomfort but says this is stress related;  We reviewed the following medical problems during today's office visit >>     Glaucoma> she reports Laser surg by Savannah Hubbard, Savannah Hubbard 12/14; on 3 drops & Methazolamide50Bid w/ improved ocular pressures by her hx...     HBP> on Amlod10,  Losar50, Lasix20, K20/d; BP= 134/74 w/ pulse 60; she denies HA, CP, palpit, SOB, edema, etc; states her energy is good off the BBlocker...    Ven Insuffic> she knows to elim sodium, elev legs, wear support hose, on Lasix20...    CHOL> on Simva20; FLP 6/17 showed TChol 149, TG 107, HDL 48, LDL 80; rec continue same med...    Multinod Thyroid> on Synthroid88; she is feeling  well & f/u TSH 6/17 = 0.53    Overweight> improved! Wt is stable at 160#; we reviewed diet, exercise, wt reduction strategies...    Hubbard- GERD, IBS, Constip, Hems> on Linzess290; followed by [facility] for Hubbard, she had colonoscopy 5/13 showing hems only...    GYN- DrStringer for PAPs and mammograms...    Hx RenalCellCa> prev followed by Savannah Hubbard- s/p left radical nephrectomy 2005, last seen 11/13 & he released her- stable w/o signs of recurrence or mets; Creat stable at 1.1, UA clear, CXR- neg...    DJD, LBP> she saw TP 12/13 w/ right back pain- treated w/ Mobic, Skelaxin and improved...     Depression- sister Savannah Hubbard has passed away EXAM shows Afeb, VSS, O2sat=100% on RA;  HEENT- neg, mallampati2;  Chest- clear w/o w/r/r;  Heart- RR w/o m/r/g;  Abd- soft, nontender, neg;  Ext- neg w/o c/c/e;  Neuro- intact...   CXR 05/16/16>  Borderline heart size, clear lungs- NAD...  LABS 05/16/16>  FLP- at goals on Simva20;  Chems- wnl w/ BS=97, Cr=0.96, LFTs wnl;  CBC- wnl w/ Hg=14.1;  TSH=0.53;  VitD=44;  UA- clear IMP/PLAN>>  Karlynn remains stable, doing well on current meds- continue same;  We reviewed diet + exercise prescriptions;     ~  May 20, 2017:  17yrRRamonareports another good yr- but wt is up 5# & she wonders if she needs another fluid pill?  Her GYN is DrStringer, in menopause & he prescribed some medication which helped the flashes but she gained more weight so she stopped it on her own, no rx now...  We reviewed the following interval medical notes avail in Epic>      She saw Ophthalmology/ Savannah Hubbard- DrBond on  04/05/17>  Stable visual field defect (superior & infero-nasal)- stable, followed for severe glaucoma, on 4 eye gtts...   We reviewed the following medical problems during today's office visit>      Glaucoma> she reports Laser surg by DN[facility] Ravens Savannah Hubbard 12/14; on 3-4 drops now & Methazolamide50Bid (Neptazane) w/ improved ocular pressures by her hx...     HBP> on Amlod10, Losar50, Lasix20; BP= 128/80 w/ pulse 60; she denies HA, CP, palpit, SOB, edema, etc; states her energy is good off the BBlocker...    Ven Insuffic> she knows to elim sodium, elev legs, wear support hose, on Lasix20...    CHOL> on Simva20; FLP 6/18 shows TChol 184, TG 131, HDL 49, LDL 109; rec continue same med, diet, exercise...    Multinod Thyroid> on Synthroid88; she is feeling well & f/u TSH 6/18 = 0.56    Overweight> improved! Wt is up 5# at 165#; we reviewed diet, exercise, wt reduction strategies...    Hubbard- GERD, IBS, Constip, Hems> on Linzess290; [facility] she had colonoscopy 5/13 showing hems only...    GYN- DrStringer for PAPs and mammograms...    Hx RenalCellCa> prev followed by Savannah Hubbard- s/p left radical nephrectomy 2005, last seen 11/13 & he released her- stable w/o signs of recurrence or mets; Creat stable at 1.1, UA clear, CXR- neg...    DJD, LBP> she saw TP 12/13 w/ right back pain- treated w/ Mobic, Skelaxin and improved...     Depression- sister Savannah RMarijo Filehas passed away, pt does not want meds...    Health Maintenance>  Pt refuses Flu vaccine, due for TDaP today... EXAM shows Afeb, VSS, O2sat=100% on RA;  HEENT- neg, mallampati2;  Chest- clear w/o w/r/r;  Heart- RR w/o m/r/g;  Abd- soft, nontender, neg;  Ext- neg w/o c/c/e;  Neuro- intact...   CXR 05/20/17>  Normal heart size, clear lungs- no infiltrates, spots, effusions, etc- NAD  LABS 05/21/27>  FLP- ok w/ LDL=109 on diet + Simva20;  Chems- wnl w/ BS=105, Cr=1.17, LFTs wnl;  CBC- wnl w/ Hg=15.7;  TSH= 0.56 on Synthroid88... IMP/PLAN>>   Savannah Hubbard is stable overall- rec to continue same meds, diet, exercise;  Continue regular monitoring by Ophthalmology & GYN;  LABS & CXR are OK- we discussed checking right renal sonar...           PROBLEM LIST:       HYPERTENSION (ICD-401.9) >>  ~  Baseline EKG 2009 showed SBrady, rate56, wnl, NAD...  ~  on METOPROLOL 61mBid,  AMLODIPNE 113md & LASIX 2077m... she is ACE intolerant...  ~  3/12:  BP= 136/88 and she reports 120's/ 70's at home... denies HA, fatigue, visual changes, CP, palipit, dizziness, syncope, dyspnea, edema, etc... since she has only one kidney we discussed better control needed (Bisoprolol changed to Metoprolol)... ~  5/13:  BP= 120/84 & similar at home; she remains asymptomatic & stable on these 3 meds... ~  CXR 11/13 showed norm heart size, clear lungs, NAD...  ~  6/14: on Metoprolol50Bid, Amlod10, Lasix20; BP= 118/72 & she denies HA, CP, palpit, SOB, edema, etc... ~  7/15: on Metoprolol50Bid, Amlod10, Lasix20; BP= 120/78 but pulse in 40's; she denies HA, CP, palpit, SOB, edema, etc; states her energy is good; EKG w/ SBrady, rate 44, otherw wnl; [facility] & [facility] ~  8/16: on Amlod10, Losar50, Lasix20; BP= 116/80 w/ pulse in 58; she denies HA, CP, palpit, SOB, edema, etc; states her energy is good off the BBlocker.  VENOUS INSUFFICIENCY (ICD-459.81) - she has mild VI and knows to avoid sodium, elevate legs, wear support hose, & take the Lasix Qam...  HYPERCHOLESTEROLEMIA (ICD-272.0) - on SIMVASTATIN 52m56mstarted 8/09... ~  FLP [place]7 showed TChol 187, TG 108, HDL 38, LDL 128... she prefered diet alone. ~  FLP [place] showed TChol 187, TG 109, HDL 35, LDL 130... rec to start Simvastatin20. ~  [facility] TChol 153, TG 70, HDL 47, LDL 92... rec> continue Simva20... ~  Labs 3/12 on Simva20 showed TChol 161, TG 148, HDL 43, LDL 89 ~  Labs 5/13 on Simva20 showed TChol 126, TG 84, HDL 45, LDL 64 ~  Labs 6/14 on Simva20 showed TChol 198,  TG 83, HDL 60, LDL 121 (what happened? rec same med, better diet, get wt down). ~  Labs 7/15 on Simva20 showed TChol 135, TG 79, HDL 44, LDL 75 ~  Labs 8/16 on Simva20 showed TChol 145, TG 71, HDL 50, LDL 81  NONTOXIC MULTINODULAR GOITER (ICD-241.1) - on SYNTHROID 75mc19m(?decreased from 88 to 75 in 2012 but pharm didn't execute this order til 2013?)... hx palp thyroid on exam w/ eval in 2005 showing inhomogeneous uptake bilat w/ sonar showing at least 6 sm nodules- mixed solid & cystic lesions c/w multinodular gland... Synthroid suppression started and dose adjusted... ~  labs 7/08 showed TSH= 0.16 ~  labs 8/09 showed TSH= 0.27 ~  [facility], T4= 11.1, T3uptake= 29, FTI= 3.2... reMarland Kitchen> continue same dose. ~  Labs 3/12 on Levothy88 showed TSH= 0.18 and dose decr from 88mcg21mo 75mcg/60m ~  Labs 5/13 on Levothy75 for a few weeks showed TSH=0.85... We decided to continue the 75mcg/d58me... ~  6/14: she is c/o fatigue, thinning hair, wt gain; Labs on Synthroid75 showed TSH= 4.07 & she is rec to incr Synthroid back to 37mg/d... ~  Labs 10/14 on Synthroid88 showed TSH= 1.02 ~  Labs 7/15 on Synthroid88 showed TSH= 0.33 ~  Labs 8/16 on Synthroid88 showed TSH= 0.82  OVERWEIGHT (ICD-278.02) - we reviewed diet, exercise, wt reduction strategies... ~  Weight 3/12 = 170# ~  Weight 5/13 = 166# ~  Weight 6/14 = 173# ~  Weight 7/15 = 161#... great job on diet, exercise, wt reduction... ~  Weight 8/16 = 150#  GERD (ICD-530.81) - min reflux symptoms intermittently- OTC Rx as needed.  IRRITABLE BOWEL SYNDROME & CONSTIPATION >> she had a neg colonoscopy in 1989 by DrPatterson ([facility])... ~  5/13:  She tells me that [facility] her GYN, DrStringer; she has Constipation predom IBS & was in the habit of using a suppos every day to go; [facility]Mann [facility] & pt reports that this has helped a lot  & she requests that I refill this med for her- ok... ~  [facility]- otherw neg...   GYN >> she sees DrStringer for her GYN needs; she assures me that she is up to date & doing well...  RENAL CELL CANCER (ICD-189.0) - she had an incidental left renal mass found on CTAbd [facility] (her brother had DM and they were studying the siblings)...  5x6cm left lower pole mass found and removed by Savannah Hubbard 10/05= renal cell cancer... she sees Savannah Hubbard yearly now- last CTAbd in 2007 was OK... CXR & Labs here w/ copy to Savannah Hubbard... ~  11/13: she had yearly f/u eval Savannah Hubbard> s/p left radical nephrectomy 2005, no signs of recurrence or mets, CXR was reported clear...  ~  We do not have Urology note from 2014 in Epic system => pt reports that Savannah Hubbard has released her 7 we are to check CXR & UA yearly...   DEGENERATIVE JOINT DISEASE (ICD-715.90) - she has had LBP, left hip pain, and shoulder discomfort... all Rx w/ MOBIC 7.45m90mrn... ~  12/13: she saw TP w/ right side pain> feels like a catch, Rx w/ Mobic, heat, Skelaxin...   BACK PAIN, LUMBAR (ICD-724.2)  ANXIETY >> some stress w/ husb's open heart surg in 2012...   Health Maintenance - her GYN is DrStringer and she is s/p hysterectomy... [facility]... Her Hubbard is DrNat-Mann & she had colonoscopy 5/13 as above...    Past Surgical History:  Procedure Laterality Date  . ABDOMINAL HYSTERECTOMY    . left nephrectomy for renal cell cancer  2005   Dr. GraRisa Grill Outpatient Encounter Prescriptions as of 05/20/2017  Medication Sig  . amLODipine (NORVASC) 10 MG tablet TAKE 1 TABLET (10 MG TOTAL) BY MOUTH DAILY.  . brimonidine (ALPHAGAN P) 0.1 % SOLN Place 1 drop into both eyes 2 (two) times daily.  . dorzolamide-timolol (COSOPT) 22.3-6.8 MG/ML ophthalmic solution Place 1 drop into both eyes 2 (two) times daily.  .  furosemide (LASIX) 20 MG tablet TAKE 1 TABLET (20 MG TOTAL) BY MOUTH DAILY.  . lMarland Kitchenvothyroxine (SYNTHROID, LEVOTHROID) 88 MCG tablet TAKE 1 TABLET (88 MCG TOTAL) BY MOUTH DAILY BEFORE BREAKFAST.  . lMarland Kitchennaclotide (LINZESS) 290 MCG CAPS capsule Take 1 capsule (290 mcg  total) by mouth daily.  [name] losartan (COZAAR) 50 MG tablet TAKE 1 TABLET (50 MG TOTAL) BY MOUTH DAILY.  . methazolamide (NEPTAZANE) 50 MG tablet Take 50 mg by mouth 2 (two) times daily.  . simvastatin (ZOCOR) 20 MG tablet TAKE 1 TABLET (20 MG TOTAL) BY MOUTH EVERY EVENING.  . TRAVATAN Z 0.004 % SOLN ophthalmic solution Place 1 drop into both eyes At bedtime.  . [DISCONTINUED] amLODipine (NORVASC) 10 MG tablet TAKE 1 TABLET (10 MG TOTAL) BY MOUTH DAILY.  . [DISCONTINUED] furosemide (LASIX) 20 MG tablet TAKE 1 TABLET (20 MG TOTAL) BY MOUTH DAILY.  . [DISCONTINUED] levothyroxine (SYNTHROID, LEVOTHROID) 88 MCG tablet TAKE 1 TABLET (88 MCG TOTAL) BY MOUTH DAILY BEFORE BREAKFAST.  . [DISCONTINUED] LINZESS 290 MCG CAPS capsule TAKE 1 CAPSULE BY MOUTH DAILY.  . [DISCONTINUED] losartan (COZAAR) 50 MG tablet TAKE 1 TABLET (50 MG TOTAL) BY MOUTH DAILY.  . [DISCONTINUED] simvastatin (ZOCOR) 20 MG tablet TAKE 1 TABLET (20 MG TOTAL) BY MOUTH EVERY EVENING.  [name] potassium chloride SA (KLOR-CON M20) 20 MEQ tablet Take 1 tablet (20 mEq total) by mouth daily. (Patient not taking: Reported on 05/20/2017)   No facility-administered encounter medications on Hubbard as of 05/20/2017.     Allergies  Allergen Reactions  . Lisinopril     angioedema  . Morphine     REACTION: ITCHING  . Sulfonamide Derivatives     REACTION: BURNING, IRRITATION    Immunization History  Administered Date(s) Administered  . Tdap 05/20/2017    Current Medications, Allergies, Past Medical History, Past Surgical History, Family History, and Social History were reviewed in [employer] record.   Review of Systems    See HPI - all other systems neg except as  noted... The patient denies anorexia, fever, weight loss, weight gain, vision loss, decreased hearing, hoarseness, chest pain, syncope, dyspnea on exertion, peripheral edema, prolonged cough, headaches, hemoptysis, abdominal pain, melena, hematochezia, severe indigestion/heartburn, hematuria, incontinence, muscle weakness, suspicious skin lesions, transient blindness, difficulty walking, depression, unusual weight change, abnormal bleeding, enlarged lymph nodes, and angioedema.     Objective:   Physical Exam      WD, WN, 61 y/o BF in NAD... GENERAL:  Alert & oriented; pleasant & cooperative... HEENT:  [place]/AT, EOM-wnl, PERRLA, Glasses, EACs-clear, TMs-wnl, NOSE-clear, THROAT-clear & wnl. NECK:  Supple w/ full ROM; no JVD; normal carotid impulses w/o bruits; palp thyroid w/ nodular feel; no lymphadenopathy. CHEST:  Clear to P & A; without wheezes/ rales/ or rhonchi heard... HEART:  Regular Rhythm; without murmurs/ rubs/ or gallops detected... ABDOMEN:  Soft & nontender; normal bowel sounds; no organomegaly or masses palpated... EXT: without deformities or arthritic changes; no varicose veins/ venous insuffic/ or edema. NEURO:  CN's intact; motor testing normal; sensory testing normal; gait normal & balance OK. DERM:   Neg- no rash, lesions, etc...  RADIOLOGY DATA:  Reviewed in the EPIC EMR & discussed w/ the patient...  LABORATORY DATA:  Reviewed in the EPIC EMR & discussed w/ the patient...   Assessment:    05/20/17>  Krystelle is stable overall- rec to continue same meds, diet, exercise;  Continue regular monitoring by Ophthalmology & GYN;  LABS & CXR are OK- we discussed checking right renal sonar...   HBP>  Controlled on her 3 meds; she is intol to ACE rx; & had Bradycardia from BBlocker=> we switched to ARB + Amlod & Lasix w/ good control...  Ven Insuffic>  Aware, she knows to elim sodium, elev legs, wear  support hose, & take the Lasix...  CHOL>  on Simva20 + diet; parameters are not  as good this yr & asked to get on better diet, get wt down etc..  Multinod Thyroid>  On Synthroid 14mg/d & TSH is improved, pt feels better as well...  Overweight>  Weight is up&down around 160# & we reviewed diet/ exercise/ wt reduction strategies...  Hubbard> GERD, IBS w/ Constip>  [facility]<Mann & she asks usKoreao refill this Rx for her- ok...  Hx Renal Cell Ca w/ left nephrectomy 2005>  Prev followed by Savannah Hubbard- he released her & we check a CXR & UA yearly...  Mild DJD, LBP>  Aware, she uses OTC analgesics as needed; we discussed exercise program...     Plan:     Patient's Medications  New Prescriptions   No medications on Hubbard  Previous Medications   BRIMONIDINE (ALPHAGAN P) 0.1 % SOLN    Place 1 drop into both eyes 2 (two) times daily.   DORZOLAMIDE-TIMOLOL (COSOPT) 22.3-6.8 MG/ML OPHTHALMIC SOLUTION    Place 1 drop into both eyes 2 (two) times daily.   METHAZOLAMIDE (NEPTAZANE) 50 MG TABLET    Take 50 mg by mouth 2 (two) times daily.   POTASSIUM CHLORIDE SA (KLOR-CON M20) 20 MEQ TABLET    Take 1 tablet (20 mEq total) by mouth daily.   TRAVATAN Z 0.004 % SOLN OPHTHALMIC SOLUTION    Place 1 drop into both eyes At bedtime.  Modified Medications   Modified Medication Previous Medication   AMLODIPINE (NORVASC) 10 MG TABLET amLODipine (NORVASC) 10 MG tablet      TAKE 1 TABLET (10 MG TOTAL) BY MOUTH DAILY.    TAKE 1 TABLET (10 MG TOTAL) BY MOUTH DAILY.   FUROSEMIDE (LASIX) 20 MG TABLET furosemide (LASIX) 20 MG tablet      TAKE 1 TABLET (20 MG TOTAL) BY MOUTH DAILY.    TAKE 1 TABLET (20 MG TOTAL) BY MOUTH DAILY.   LEVOTHYROXINE (SYNTHROID, LEVOTHROID) 88 MCG TABLET levothyroxine (SYNTHROID, LEVOTHROID) 88 MCG tablet      TAKE 1 TABLET (88 MCG TOTAL) BY MOUTH DAILY BEFORE BREAKFAST.    TAKE 1 TABLET (88 MCG TOTAL) BY MOUTH DAILY BEFORE BREAKFAST.   LINACLOTIDE (LINZESS) 290 MCG CAPS CAPSULE LINZESS 290 MCG CAPS capsule      Take 1 capsule (290 mcg total) by  mouth daily.    TAKE 1 CAPSULE BY MOUTH DAILY.   LOSARTAN (COZAAR) 50 MG TABLET losartan (COZAAR) 50 MG tablet      TAKE 1 TABLET (50 MG TOTAL) BY MOUTH DAILY.    TAKE 1 TABLET (50 MG TOTAL) BY MOUTH DAILY.   SIMVASTATIN (ZOCOR) 20 MG TABLET simvastatin (ZOCOR) 20 MG tablet      TAKE 1 TABLET (20 MG TOTAL) BY MOUTH EVERY EVENING.    TAKE 1 TABLET (20 MG TOTAL) BY MOUTH EVERY EVENING.  Discontinued Medications   No medications on Hubbard

## 2017-05-22 DIAGNOSIS — H401133 Primary open-angle glaucoma, bilateral, severe stage: Secondary | ICD-10-CM | POA: Diagnosis not present

## 2017-06-03 ENCOUNTER — Ambulatory Visit (HOSPITAL_COMMUNITY): Payer: BLUE CROSS/BLUE SHIELD

## 2017-07-01 DIAGNOSIS — Z6831 Body mass index (BMI) 31.0-31.9, adult: Secondary | ICD-10-CM | POA: Diagnosis not present

## 2017-07-01 DIAGNOSIS — Z1231 Encounter for screening mammogram for malignant neoplasm of breast: Secondary | ICD-10-CM | POA: Diagnosis not present

## 2017-07-01 DIAGNOSIS — Z01419 Encounter for gynecological examination (general) (routine) without abnormal findings: Secondary | ICD-10-CM | POA: Diagnosis not present

## 2017-07-01 DIAGNOSIS — N951 Menopausal and female climacteric states: Secondary | ICD-10-CM | POA: Diagnosis not present

## 2017-07-03 DIAGNOSIS — E782 Mixed hyperlipidemia: Secondary | ICD-10-CM | POA: Diagnosis not present

## 2017-07-03 DIAGNOSIS — I10 Essential (primary) hypertension: Secondary | ICD-10-CM | POA: Diagnosis not present

## 2017-07-03 DIAGNOSIS — E039 Hypothyroidism, unspecified: Secondary | ICD-10-CM | POA: Diagnosis not present

## 2017-09-16 DIAGNOSIS — Z85528 Personal history of other malignant neoplasm of kidney: Secondary | ICD-10-CM | POA: Diagnosis not present

## 2017-09-16 DIAGNOSIS — R3915 Urgency of urination: Secondary | ICD-10-CM | POA: Diagnosis not present

## 2017-10-25 DIAGNOSIS — E782 Mixed hyperlipidemia: Secondary | ICD-10-CM | POA: Diagnosis not present

## 2017-10-25 DIAGNOSIS — E039 Hypothyroidism, unspecified: Secondary | ICD-10-CM | POA: Diagnosis not present

## 2017-10-25 DIAGNOSIS — I10 Essential (primary) hypertension: Secondary | ICD-10-CM | POA: Diagnosis not present

## 2018-01-29 DIAGNOSIS — E782 Mixed hyperlipidemia: Secondary | ICD-10-CM | POA: Diagnosis not present

## 2018-01-29 DIAGNOSIS — I10 Essential (primary) hypertension: Secondary | ICD-10-CM | POA: Diagnosis not present

## 2018-01-29 DIAGNOSIS — J3089 Other allergic rhinitis: Secondary | ICD-10-CM | POA: Diagnosis not present

## 2018-01-29 DIAGNOSIS — E039 Hypothyroidism, unspecified: Secondary | ICD-10-CM | POA: Diagnosis not present

## 2018-02-03 DIAGNOSIS — Z6832 Body mass index (BMI) 32.0-32.9, adult: Secondary | ICD-10-CM | POA: Diagnosis not present

## 2018-02-03 DIAGNOSIS — Z008 Encounter for other general examination: Secondary | ICD-10-CM | POA: Diagnosis not present

## 2018-02-19 DIAGNOSIS — H401133 Primary open-angle glaucoma, bilateral, severe stage: Secondary | ICD-10-CM | POA: Diagnosis not present

## 2018-03-07 DIAGNOSIS — Z6832 Body mass index (BMI) 32.0-32.9, adult: Secondary | ICD-10-CM | POA: Diagnosis not present

## 2018-03-07 DIAGNOSIS — Z008 Encounter for other general examination: Secondary | ICD-10-CM | POA: Diagnosis not present

## 2018-04-04 DIAGNOSIS — Z6832 Body mass index (BMI) 32.0-32.9, adult: Secondary | ICD-10-CM | POA: Diagnosis not present

## 2018-04-04 DIAGNOSIS — Z008 Encounter for other general examination: Secondary | ICD-10-CM | POA: Diagnosis not present

## 2018-05-09 ENCOUNTER — Telehealth: Payer: Self-pay | Admitting: *Deleted

## 2018-05-09 NOTE — Telephone Encounter (Signed)
Error

## 2018-05-20 ENCOUNTER — Encounter: Payer: Self-pay | Admitting: Pulmonary Disease

## 2018-05-20 ENCOUNTER — Ambulatory Visit (INDEPENDENT_AMBULATORY_CARE_PROVIDER_SITE_OTHER): Payer: BLUE CROSS/BLUE SHIELD

## 2018-05-20 ENCOUNTER — Ambulatory Visit: Payer: BLUE CROSS/BLUE SHIELD | Admitting: Pulmonary Disease

## 2018-05-20 ENCOUNTER — Ambulatory Visit (INDEPENDENT_AMBULATORY_CARE_PROVIDER_SITE_OTHER)
Admission: RE | Admit: 2018-05-20 | Discharge: 2018-05-20 | Disposition: A | Discharge status: Home / Self Care | Payer: BLUE CROSS/BLUE SHIELD | Source: Ambulatory Visit | Attending: Pulmonary Disease | Admitting: Pulmonary Disease

## 2018-05-20 VITALS — BP 126/70 | HR 51 | Temp 97.8°F | Ht 62.0 in | Wt 171.6 lb

## 2018-05-20 DIAGNOSIS — C642 Malignant neoplasm of left kidney, except renal pelvis: Secondary | ICD-10-CM

## 2018-05-20 DIAGNOSIS — Z905 Acquired absence of kidney: Secondary | ICD-10-CM

## 2018-05-20 DIAGNOSIS — Z0000 Encounter for general adult medical examination without abnormal findings: Secondary | ICD-10-CM

## 2018-05-20 DIAGNOSIS — Z23 Encounter for immunization: Secondary | ICD-10-CM

## 2018-05-20 DIAGNOSIS — M159 Polyosteoarthritis, unspecified: Secondary | ICD-10-CM

## 2018-05-20 DIAGNOSIS — E7800 Pure hypercholesterolemia, unspecified: Secondary | ICD-10-CM

## 2018-05-20 DIAGNOSIS — M150 Primary generalized (osteo)arthritis: Secondary | ICD-10-CM

## 2018-05-20 DIAGNOSIS — E042 Nontoxic multinodular goiter: Secondary | ICD-10-CM

## 2018-05-20 DIAGNOSIS — I872 Venous insufficiency (chronic) (peripheral): Secondary | ICD-10-CM

## 2018-05-20 DIAGNOSIS — I10 Essential (primary) hypertension: Secondary | ICD-10-CM

## 2018-05-20 MED ORDER — LOSARTAN POTASSIUM 100 MG PO TABS: 100.0000 mg | ORAL_TABLET | Freq: Every day | ORAL | 3 refills | Status: DC

## 2018-05-20 MED ORDER — AMLODIPINE BESYLATE 5 MG PO TABS: 5.0000 mg | ORAL_TABLET | Freq: Every day | ORAL | 11 refills | Status: DC

## 2018-05-20 MED ORDER — FUROSEMIDE 20 MG PO TABS: ORAL_TABLET | ORAL | 3 refills | Status: DC

## 2018-05-20 MED ORDER — SIMVASTATIN 20 MG PO TABS: ORAL_TABLET | ORAL | 3 refills | Status: DC

## 2018-05-20 MED ORDER — LINACLOTIDE 290 MCG PO CAPS: 290.0000 ug | ORAL_CAPSULE | Freq: Every day | ORAL | 3 refills | Status: DC

## 2018-05-20 MED ORDER — LEVOTHYROXINE SODIUM 88 MCG PO TABS: ORAL_TABLET | ORAL | 3 refills | Status: DC

## 2018-05-20 MED ORDER — AMLODIPINE BESYLATE 5 MG PO TABS: 5.0000 mg | ORAL_TABLET | Freq: Every day | ORAL | 3 refills | Status: DC

## 2018-05-20 MED ORDER — LOSARTAN POTASSIUM 100 MG PO TABS: 100.0000 mg | ORAL_TABLET | Freq: Every day | ORAL | 11 refills | Status: DC

## 2018-05-20 NOTE — Progress Notes (Signed)
Subjective:     Patient ID: Savannah Hubbard, female   DOB: 11-17-1956, 62 y.o.   MRN: [id number]  HPI 62 y/o BF here for a follow up visit... Savannah Hubbard has multiple medical problems as listed below>> ~  SEE PREV EPIC NOTES FOR OLDER DATA >>     LABS 5/13:  FLP- wnl on Simva20;  Chem- wnl;  CBC- wnl;  TSH=0.85;  UA- clear...   ~  May 19, 2013:  46moROV & Pam is upset about her weight (difficulty losing weight), notes thinning hair, & fatigue- Savannah Hubbard wonders about her thyroid (currently on Synthroid75 w/ last TSH=0.85); follow up fasting blood work is pending & Savannah Hubbard is reassured that we will recheck her definitive TFT w/ this blood work;  We reviewed the following medical problems during today's office visit >>     Glaucoma> on 3 drops per Ophthalmology...    HBP> on Metoprolol50Bid, Amlod10, Lasix20; BP= 118/72 & Savannah Hubbard denies HA, CP, palpit, SOB, edema, etc...    Ven Insuffic> Savannah Hubbard knows to elim sodium, elev legs, wear support hose, on Lasix20...    CHOL> on Simva20; last FLP 5/13 showed TChol 126, TG 84, HDL 45, LDL 64; Savannah Hubbard will ret for fasting labs=> TChol 198, TG 83, HDL 60, LDL 121 (rec- better diet, same med for now).    Multinod Thyroid> on Synthroid75; Savannah Hubbard is c/o fatigue, thinning hair, wt gain; Labs 5/13 showed TSH= 0.85; Savannah Hubbard will ret for fasting blood work=> TSH=4.07 (OK to incr back to 818m/d).    Overweight> weight= 173# which is up 7# in the last yr; we reviewed diet, exercise, wt reduction strategies...    GI- GERD, IBS, Constip, Hems> on Linzess290; [facility] Savannah Hubbard had colonoscopy 5/13 showing hems only...    Hx RenalCellCa> followed by DrGrapey- s/p left radical nephrectomy 2005, last seen 11/13- stable w/o signs of recurrence or mets; Creat stable at 1.1    DJD, LBP> Savannah Hubbard saw TP 12/13 w/ right back pain- treated w/ Mobic, Skelaxin and improved...  We reviewed prob list, meds, xrays and labs> see below for updates >>   LABS 6/14:  FLP- fair w/ incr LDL to 121 on  same meds;  Chems- ok x BS=108;  CBC-wnl;  TSH=4.07;  UA- clear... Due to her symptoms and complaints- rec to pt to incr Synthroid back to 881md dose & recheck TSH in 3 mo...  ~  June 22, 2014:  53m42mo & recheck mult medical problems> Savannah Hubbard that Savannah Hubbard has lost 11# down to 161# today; Savannah Hubbard states that RN at work noted slow heart rate in 40's & suggested f/u here...      Glaucoma> Savannah Hubbard reports Laser surg by DrJBNani RavensU 12/14; on 3 drops & Methazolamide50Bid w/ improved ocular pressures by her hx...     HBP> on Metoprolol50Bid, Amlod10, Lasix20; BP= 120/78 but pulse in 40's; Savannah Hubbard denies HA, CP, palpit, SOB, edema, etc; states her energy is good; EKG w/ SBrady, rate 44, otherw wnl; [facility] & [facility]    Ven Insuffic> Savannah Hubbard knows to elim sodium, elev legs, wear support hose, on Lasix20...    CHOL> on Simva20; last FLP 7/15 showed TChol 135, TG 79, HDL 44, LDL 75; rec better diet & continue same med...    Multinod Thyroid> on Synthroid88; Savannah Hubbard is feeling some better overall & f/u TSH 10/14 was 1.02; Labs 7/15 showed TSH=     Overweight> weight= 161# which  is down 11# in the last yr; we reviewed diet, exercise, wt reduction strategies...    GI- GERD, IBS, Constip, Hems> on Linzess290; [facility], Savannah Hubbard had colonoscopy 5/13 showing hems only...    Hx RenalCellCa> followed by DrGrapey- s/p left radical nephrectomy 2005, last seen 11/13- stable w/o signs of recurrence or mets; Creat stable at 1.1    DJD, LBP> Savannah Hubbard saw TP 12/13 w/ right back pain- treated w/ Mobic, Skelaxin and improved...  We reviewed prob list, meds, xrays and labs> see below for updates >>   EKG 7/15 showed SBrady, rate44, otherw wnl; we decided to stop Metoprolol & switch to Losartan50...  LABS 7/15: FLP- at goals on Simva20, continue same;  Chems- ok x K=3.3 rec to start K20/d;  CBC- wnl;  TSH=0.33 on Synthroid88 & Savannah Hubbard prefers this dose...  ~  [place] 2015[place]  [place]  Savannah Hubbard a good yr- doing well on diet & exercise program w/ wt down 10# to 150# today... We reviewed the following medical problems during today's office visit >>     Glaucoma> Savannah Hubbard reports Laser surg by DrJBond, [facility] 12/14; on 3 drops & Methazolamide50Bid w/ improved ocular pressures by her hx...     HBP> on Amlod10, Losar50, Lasix20; BP= 116/80 w/ pulse in 58; Savannah Hubbard denies HA, CP, palpit, SOB, edema, etc; states her energy is good off the BBlocker & pulse rate back to normal...    Ven Insuffic> Savannah Hubbard knows to elim sodium, elev legs, wear support hose, on Lasix20...    CHOL> on Simva20; FLP 8/16 showed TChol 145, TG 71, HDL 50, LDL 81; rec continue same med...    Multinod Thyroid> on Synthroid88; Savannah Hubbard is feeling well & f/u TSH 8/16 = 0.82    Overweight> improved! Wt is down 10# further to 150#; we reviewed diet, exercise, wt reduction strategies...    GI- GERD, IBS, Constip, Hems> on Linzess290; [facility] Savannah Hubbard had colonoscopy 5/13 showing hems only...    Hx RenalCellCa> followed by DrGrapey- s/p left radical nephrectomy 2005, last seen 11/13 & he released her- stable w/o signs of recurrence or mets; Creat stable at 1.1    DJD, LBP> Savannah Hubbard saw TP 12/13 w/ right back pain- treated w/ Mobic, Skelaxin and improved...  We reviewed prob list, meds, xrays and labs> see below for updates >>   CXR 06/30/15 showed norm heart size, clear lungs, NAD...  LABS 8/16:  FLP- at goals on Simva20;  Chems- wnl;  CBC- wnl;  TSH=0.82;  UA- clear, no blood...  ~  May 16, 2016:  10-175moOV & medical f/u visit/ CPX>  AnShaeleigheturns for CPX & reports a good interval w/o new complaints or concerns- Savannah Hubbard does mention intermittent chest discomfort but says this is stress related;  We reviewed the following medical problems during today's office visit >>     Glaucoma> Savannah Hubbard reports Laser surg by DrJBond, [facility] 12/14; on 3 drops & Methazolamide50Bid w/ improved ocular pressures by her hx...     HBP> on Amlod10,  Losar50, Lasix20, K20/d; BP= 134/74 w/ pulse 60; Savannah Hubbard denies HA, CP, palpit, SOB, edema, etc; states her energy is good off the BBlocker...    Ven Insuffic> Savannah Hubbard knows to elim sodium, elev legs, wear support hose, on Lasix20...    CHOL> on Simva20; FLP 6/17 showed TChol 149, TG 107, HDL 48, LDL 80; rec continue same med...    Multinod Thyroid> on Synthroid88; Savannah Hubbard is feeling  well & f/u TSH 6/17 = 0.53    Overweight> improved! Wt is stable at 160#; we reviewed diet, exercise, wt reduction strategies...    GI- GERD, IBS, Constip, Hems> on Linzess290; followed by [facility] for GI, Savannah Hubbard had colonoscopy 5/13 showing hems only...    GYN- DrStringer for PAPs and mammograms...    Hx RenalCellCa> prev followed by DrGrapey- s/p left radical nephrectomy 2005, last seen 11/13 & he released her- stable w/o signs of recurrence or mets; Creat stable at 1.1, UA clear, CXR- neg...    DJD, LBP> Savannah Hubbard saw TP 12/13 w/ right back pain- treated w/ Mobic, Skelaxin and improved...     Depression- sister Cornelia Marijo File has passed away EXAM shows Afeb, VSS, O2sat=100% on RA;  HEENT- neg, mallampati2;  Chest- clear w/o w/r/r;  Heart- RR w/o m/r/g;  Abd- soft, nontender, neg;  Ext- neg w/o c/c/e;  Neuro- intact...   CXR 05/16/16>  Borderline heart size, clear lungs- NAD...  LABS 05/16/16>  FLP- at goals on Simva20;  Chems- wnl w/ BS=97, Cr=0.96, LFTs wnl;  CBC- wnl w/ Hg=14.1;  TSH=0.53;  VitD=44;  UA- clear IMP/PLAN>>  Savannah Hubbard remains stable, doing well on current meds- continue same;  We reviewed diet + exercise prescriptions;    ~  May 20, 2017:  53[place]RL[place]reports another good yr- but wt is up 5# & Savannah Hubbard wonders if Savannah Hubbard needs another fluid pill?  Her GYN is DrStringer, in menopause & he prescribed some medication which helped the flashes but Savannah Hubbard gained more weight so Savannah Hubbard stopped it on her own, no rx now...  We reviewed the following interval medical notes avail in Epic>      Savannah Hubbard saw Ophthalmology/ [facility]- DrBond on 04/05/17>   Stable visual field defect (superior & infero-nasal)- stable, followed for severe glaucoma, on 4 eye gtts...   We reviewed the following medical problems during today's office visit>      Glaucoma> Savannah Hubbard reports Laser surg by DNani Ravens [facility] 12/14; on 3-4 drops now & Methazolamide50Bid (Neptazane) w/ improved ocular pressures by her hx...     HBP> on Amlod10, Losar50, Lasix20; BP= 128/80 w/ pulse 60; Savannah Hubbard denies HA, CP, palpit, SOB, edema, etc; states her energy is good off the BBlocker...    Ven Insuffic> Savannah Hubbard knows to elim sodium, elev legs, wear support hose, on Lasix20...    CHOL> on Simva20; FLP 6/18 shows TChol 184, TG 131, HDL 49, LDL 109; rec continue same med, diet, exercise...    Multinod Thyroid> on Synthroid88; Savannah Hubbard is feeling well & f/u TSH 6/18 = 0.56    Overweight> improved! Wt is up 5# at 165#; we reviewed diet, exercise, wt reduction strategies...    GI- GERD, IBS, Constip, Hems> on Linzess290; [facility] Hubbard had colonoscopy 5/13 showing hems only...    GYN- DrStringer for PAPs and mammograms...    Hx RenalCellCa> prev followed by DrGrapey- s/p left radical nephrectomy 2005, last seen 11/13 & he released her- stable w/o signs of recurrence or mets; Creat stable at 1.1, UA clear, CXR- neg...    DJD, LBP> Savannah Hubbard saw TP 12/13 w/ right back pain- treated w/ Mobic, Skelaxin and improved...     Depression- sister Cornelia RMarijo Filehas passed away, pt does not want meds...    Health Maintenance>  Pt refuses Flu vaccine, due for TDaP today... EXAM shows Afeb, VSS, O2sat=100% on RA;  HEENT- neg, mallampati2;  Chest- clear w/o w/r/r;  Heart- RR w/o m/r/g;  Abd- soft, nontender, neg;  Ext- neg w/o c/c/e;  Neuro- intact...   CXR 05/20/17>  Normal heart size, clear lungs- no infiltrates, spots, effusions, etc- NAD  LABS 05/21/27>  FLP- ok w/ LDL=109 on diet + Simva20;  Chems- wnl w/ BS=105, Cr=1.17, LFTs wnl;  CBC- wnl w/ Hg=15.7;  TSH= 0.56 on Synthroid88... IMP/PLAN>>  Janene is  stable overall- rec to continue same meds, diet, exercise;  Continue regular monitoring by Ophthalmology & GYN;  LABS & CXR are OK- we discussed checking right renal sonar...   ~  May 20, 2018:  Yearly ROV & CPX>  Anju returns reporting a good yr overall but weight is up 6# to 171# today assoc w/ some L>R leg swelling due to her VI;  Also notes some vague left-sided LBP w/ an assoc "tight" feeling down her left leg- offered XRays, CT vs MRI, Ortho consult etc but Savannah Hubbard wants to hold-off & try rest/ heat/ OTC analgesics first...  We reviewed the following interval medical notes avail in Epic>      Savannah Hubbard saw Ophthalmology/ [facility] on 02/19/18>  Followed for severe glaucoma on 3 eye drops and Methazolamide50Bid; Stable visual field defect (superior & infero-nasal)- stable... We reviewed the following medical problems during today's office visit>      Glaucoma> Savannah Hubbard reports Laser surg by Nani Ravens, [facility] 12/14; on 3-4 drops now & Methazolamide50Bid (Neptazane) w/ improved ocular pressures by her hx...     HBP> on Amlod10, Losar50, Lasix20; K20;  BP= 126/70 w/ pulse 60; Savannah Hubbard denies HA, CP, palpit, dizzy, SOB, etc; states her energy is good off the BBlocker but incr edema on Lasix20+Methazolamide50Bid; REC- decr Amlodipine from 10=>3m/d and incr Losartan50=>1082md...    Ven Insuffic> Savannah Hubbard knows to elim sodium, elev legs, wear support hose, on diuretics as above...    CHOL> on Simva20; FLP 6/18 shows TChol 184, TG 131, HDL 49, LDL 109; rec continue same med, diet, exercise, & ret for f/u FLP...    Multinod Thyroid> on Synthroid88; Savannah Hubbard is feeling well & f/u TSH 6/18 = 0.56    Overweight> Wt is up 5# further at 171#; we reviewed diet, exercise, wt reduction strategies...    GI- GERD, IBS, Constip, Hems> on Linzess290; [facility] had colonoscopy 5/13 showing hems only...    GYN- DrStringer for PAPs and mammograms...    Hx RenalCellCa> prev followed by DrGrapey- s/p left radical nephrectomy  2005, last seen 11/13 & he released her- stable w/o signs of recurrence or mets; Creat stable at 1.1, UA clear, CXR- neg...    DJD, LBP> Savannah Hubbard saw TP 12/13 w/ right back pain- treated w/ Mobic, Skelaxin and improved...     Depression- sister Cornelia RaMarijo Fileas passed away, pt does not want anxiety meds...    Health Maintenance>  Pt refuses Flu vaccine, agreed to TDaP + pneumonia vaccines... EXAM shows Afeb, VSS, O2sat=99% on RA;  HEENT- neg, mallampati2;  Chest- clear w/o w/r/r;  Heart- RR w/o m/r/g;  Abd- soft, nontender, neg;  Ext- neg x VI w/o c/c/+tr edema;  Neuro- intact...   CXR 05/20/17 (independently reviewed by me in the PACS system) norm heart size, clear lungs- wnl IMP/PLAN>>  Rec to continue Simva20 & follow better low fat diet;  Continue Synthroid8811md and take meds regularly;  Savannah Hubbard knows to elim sodium, elev legs, wear support hose;  We decided to decr Amlod to 5mg58mand incr Losar to 100mg65mo help decr edema & maintain BP control;  We discussed my pending retirement at the end of the yr & Savannah Hubbard will establish w/ another PCP going forward...   ~  ADDENDUM>> FASTING blood work not done til 08/18/18>  FLP- TChol 151, TG 168, HDL 51, LDL 67;  Chems- ok w/ K=3.5, HCO3=24, BS=103, Cr=0.98, LFTs=wnl;  CBC- wnl w/ Hg=14.6;  TSH=0.77... Savannah Hubbard also brought reading of her BP checks at home=> good control of BP ~130's/ 80's;  REC to continue same meds...            PROBLEM LIST:       HYPERTENSION (ICD-401.9) >>  ~  Baseline EKG 2009 showed SBrady, rate56, wnl, NAD...  ~  on METOPROLOL 35mBid,  AMLODIPNE 119md & LASIX 2077m... Savannah Hubbard is ACE intolerant...  ~  3/12:  BP= 136/88 and Savannah Hubbard reports 120's/ 70's at home... denies HA, fatigue, visual changes, CP, palipit, dizziness, syncope, dyspnea, edema, etc... since Savannah Hubbard has only one kidney we discussed better control needed (Bisoprolol changed to Metoprolol)... ~  5/13:  BP= 120/84 & similar at home; Savannah Hubbard remains asymptomatic & stable on these 3  meds... ~  CXR 11/13 showed norm heart size, clear lungs, NAD...  ~  6/14: on Metoprolol50Bid, Amlod10, Lasix20; BP= 118/72 & Savannah Hubbard denies HA, CP, palpit, SOB, edema, etc... ~  7/15: on Metoprolol50Bid, Amlod10, Lasix20; BP= 120/78 but pulse in 40's; Savannah Hubbard denies HA, CP, palpit, SOB, edema, etc; states her energy is good; EKG w/ SBrady, rate 44, otherw wnl; [facility] & [facility] ~  8/16: on Amlod10, Losar50, Lasix20; BP= 116/80 w/ pulse in 58; Savannah Hubbard denies HA, CP, palpit, SOB, edema, etc; states her energy is good off the BBlocker.  VENOUS INSUFFICIENCY (ICD-459.81) - Savannah Hubbard has mild VI and knows to avoid sodium, elevate legs, wear support hose, & take the Lasix Qam...  HYPERCHOLESTEROLEMIA (ICD-272.0) - on SIMVASTATIN 68m4mstarted 8/09... ~  FLP [place]7 showed TChol 187, TG 108, HDL 38, LDL 128... Savannah Hubbard prefered diet alone. ~  FLP [place]9 showed TChol 187, TG 109, HDL 35, LDL 130... rec to start Simvastatin20. ~  [facility] TChol 153, TG 70, HDL 47, LDL 92... rec> continue Simva20... ~  Labs 3/12 on Simva20 showed TChol 161, TG 148, HDL 43, LDL 89 ~  Labs 5/13 on Simva20 showed TChol 126, TG 84, HDL 45, LDL 64 ~  Labs 6/14 on Simva20 showed TChol 198, TG 83, HDL 60, LDL 121 (what happened? rec same med, better diet, get wt down). ~  Labs 7/15 on Simva20 showed TChol 135, TG 79, HDL 44, LDL 75 ~  Labs 8/16 on Simva20 showed TChol 145, TG 71, HDL 50, LDL 81  NONTOXIC MULTINODULAR GOITER (ICD-241.1) - on SYNTHROID 75mc77m(?decreased from 88 to 75 in 2012 but pharm didn't execute this order til 2013?)... hx palp thyroid on exam w/ eval in 2005 showing inhomogeneous uptake bilat w/ sonar showing at least 6 sm nodules- mixed solid & cystic lesions c/w multinodular gland... Synthroid suppression started and dose adjusted... ~  labs 7/08 showed TSH= 0.16 ~  labs 8/09 showed TSH= 0.27 ~  [facility], T4= 11.1, T3uptake= 29, FTI= 3.2... reMarland Kitchen> continue same  dose. ~  Labs 3/12 on Levothy88 showed TSH= 0.18 and dose decr from 88mcg6mo 75mcg/56m ~  Labs 5/13 on Levothy75 for a few weeks showed TSH=0.85... We decided to continue the 75mcg/d14me... ~  6/14: Savannah Hubbard is c/o fatigue, thinning hair, wt gain;  Labs on Synthroid75 showed TSH= 4.07 & Savannah Hubbard is rec to incr Synthroid back to 64mg/d... ~  Labs 10/14 on Synthroid88 showed TSH= 1.02 ~  Labs 7/15 on Synthroid88 showed TSH= 0.33 ~  Labs 8/16 on Synthroid88 showed TSH= 0.82  OVERWEIGHT (ICD-278.02) - we reviewed diet, exercise, wt reduction strategies... ~  Weight 3/12 = 170# ~  Weight 5/13 = 166# ~  Weight 6/14 = 173# ~  Weight 7/15 = 161#... great job on diet, exercise, wt reduction... ~  Weight 8/16 = 150#  GERD (ICD-530.81) - min reflux symptoms intermittently- OTC Rx as needed.  IRRITABLE BOWEL SYNDROME & CONSTIPATION >> Savannah Hubbard had a neg colonoscopy in 1989 by DrPatterson ([facility])... ~  5/13:  Savannah Hubbard tells me that [facility] Hubbard [facility]Stringer; Savannah Hubbard has Constipation predom IBS & was in the habit of using a suppos every day to go; [facility] & pt reports that this has helped a lot & Savannah Hubbard requests that I refill this med for her- ok... ~  [facility]- otherw neg...   GYN >> Savannah Hubbard sees DrStringer for her GYN needs; Savannah Hubbard assures me that Savannah Hubbard is up to date & doing well...  RENAL CELL CANCER (ICD-189.0) - Savannah Hubbard had an incidental left renal mass found on CTAbd [facility] (her brother had DM and they were studying the siblings)...  5x6cm left lower pole mass found and removed by DrGrapey 10/05= renal cell cancer... Savannah Hubbard sees DrGrapey yearly now- last CTAbd in 2007 was OK... CXR & Labs here w/ copy to DrGrapey... ~  11/13: Savannah Hubbard had yearly f/u eval DrGrapey> s/p left radical nephrectomy 2005, no signs of recurrence or mets,  CXR was reported clear...  ~  We do not have Urology note from 2014 in Epic system => pt reports that DrGrapey has released her 7 we are to check CXR & UA yearly...   DEGENERATIVE JOINT DISEASE (ICD-715.90) - Savannah Hubbard has had LBP, left hip pain, and shoulder discomfort... all Rx w/ MOBIC 7.37m79mrn... ~  12/13: Savannah Hubbard saw TP w/ right side pain> feels like a catch, Rx w/ Mobic, heat, Skelaxin...   BACK PAIN, LUMBAR (ICD-724.2)  ANXIETY >> some stress w/ husb's open heart surg in 2012...   Health Maintenance - her GYN is DrStringer and Savannah Hubbard is s/p hysterectomy... [facility] Hubbard [facility]... Her GI is DrNat-Mann & Savannah Hubbard had colonoscopy 5/13 as above...    Past Surgical History:  Procedure Laterality Date  . ABDOMINAL HYSTERECTOMY    . left nephrectomy for renal cell cancer  2005   Dr. GraRisa Grill Outpatient Encounter Medications as of 05/20/2018  Medication Sig  . brimonidine (ALPHAGAN P) 0.1 % SOLN Place 1 drop into both eyes 2 (two) times daily.  . dorzolamide-timolol (COSOPT) 22.3-6.8 MG/ML ophthalmic solution Place 1 drop into both eyes 2 (two) times daily.  . furosemide (LASIX) 20 MG tablet TAKE 1 TABLET (20 MG TOTAL) BY MOUTH DAILY.  . lMarland Kitchenvothyroxine (SYNTHROID, LEVOTHROID) 88 MCG tablet TAKE 1 TABLET (88 MCG TOTAL) BY MOUTH DAILY BEFORE BREAKFAST.  . lMarland Kitchennaclotide (LINZESS) 290 MCG CAPS capsule Take 1 capsule (290 mcg total) by mouth daily.  . methazolamide (NEPTAZANE) 50 MG tablet Take 50 mg by mouth 2 (two) times daily.  . potassium chloride SA (KLOR-CON  M20) 20 MEQ tablet Take 1 tablet (20 mEq total) by mouth daily.  . simvastatin (ZOCOR) 20 MG tablet TAKE 1 TABLET (20 MG TOTAL) BY MOUTH EVERY EVENING.  . TRAVATAN Z 0.004 % SOLN ophthalmic solution Place 1 drop into both eyes At bedtime.  . [DISCONTINUED] amLODipine (NORVASC) 10 MG tablet TAKE 1 TABLET (10 MG TOTAL) BY MOUTH DAILY.  . [DISCONTINUED] furosemide (LASIX) 20 MG tablet TAKE 1 TABLET (20 MG TOTAL)  BY MOUTH DAILY.  . [DISCONTINUED] furosemide (LASIX) 20 MG tablet TAKE 1 TABLET (20 MG TOTAL) BY MOUTH DAILY.  . [DISCONTINUED] levothyroxine (SYNTHROID, LEVOTHROID) 88 MCG tablet TAKE 1 TABLET (88 MCG TOTAL) BY MOUTH DAILY BEFORE BREAKFAST.  . [DISCONTINUED] levothyroxine (SYNTHROID, LEVOTHROID) 88 MCG tablet TAKE 1 TABLET (88 MCG TOTAL) BY MOUTH DAILY BEFORE BREAKFAST.  . [DISCONTINUED] linaclotide (LINZESS) 290 MCG CAPS capsule Take 1 capsule (290 mcg total) by mouth daily.  . [DISCONTINUED] losartan (COZAAR) 50 MG tablet TAKE 1 TABLET (50 MG TOTAL) BY MOUTH DAILY.  . [DISCONTINUED] simvastatin (ZOCOR) 20 MG tablet TAKE 1 TABLET (20 MG TOTAL) BY MOUTH EVERY EVENING.  [name] amLODipine (NORVASC) 5 MG tablet Take 1 tablet (5 mg total) by mouth daily.  [name] losartan (COZAAR) 100 MG tablet Take 1 tablet (100 mg total) by mouth daily.  . [DISCONTINUED] amLODipine (NORVASC) 5 MG tablet Take 1 tablet (5 mg total) by mouth daily.  . [DISCONTINUED] losartan (COZAAR) 100 MG tablet Take 1 tablet (100 mg total) by mouth daily.   No facility-administered encounter medications on file as of 05/20/2018.     Allergies  Allergen Reactions  . Lisinopril     angioedema  . Morphine     REACTION: ITCHING  . Sulfonamide Derivatives     REACTION: BURNING, IRRITATION    Immunization History  Administered Date(s) Administered  . Pneumococcal Conjugate-13 05/20/2018  . Tdap 05/20/2017    Current Medications, Allergies, Past Medical History, Past Surgical History, Family History, and Social History were reviewed in [employer] record.   Review of Systems    See HPI - all other systems neg except as noted... The patient denies anorexia, fever, weight loss, weight gain, vision loss, decreased hearing, hoarseness, chest pain, syncope, dyspnea on exertion, peripheral edema, prolonged cough, headaches, hemoptysis, abdominal pain, melena, hematochezia, severe indigestion/heartburn, hematuria,  incontinence, muscle weakness, suspicious skin lesions, transient blindness, difficulty walking, depression, unusual weight change, abnormal bleeding, enlarged lymph nodes, and angioedema.     Objective:   Physical Exam      WD, WN, 62 y/o BF in NAD... GENERAL:  Alert & oriented; pleasant & cooperative... HEENT:  [place]/AT, EOM-wnl, PERRLA, Glasses, EACs-clear, TMs-wnl, NOSE-clear, THROAT-clear & wnl. NECK:  Supple w/ full ROM; no JVD; normal carotid impulses w/o bruits; palp thyroid w/ nodular feel; no lymphadenopathy. CHEST:  Clear to P & A; without wheezes/ rales/ or rhonchi heard... HEART:  Regular Rhythm; without murmurs/ rubs/ or gallops detected... ABDOMEN:  Soft & nontender; normal bowel sounds; no organomegaly or masses palpated... EXT: without deformities or arthritic changes; no varicose veins/ venous insuffic/ or edema. NEURO:  CN's intact; motor testing normal; sensory testing normal; gait normal & balance OK. DERM:   Neg- no rash, lesions, etc...  RADIOLOGY DATA:  Reviewed in the EPIC EMR & discussed w/ the patient...  LABORATORY DATA:  Reviewed in the EPIC EMR & discussed w/ the patient...   Assessment:    05/20/17>  Emmarie is stable overall- rec to continue same meds, diet,  exercise;  Continue regular monitoring by Ophthalmology & GYN;  LABS & CXR are OK- we discussed checking right renal sonar... 05/20/18>   Rec to continue Simva20 & follow better low fat diet;  Continue Synthroid41mg/d and take meds regularly;  Savannah Hubbard knows to elim sodium, elev legs, wear support hose;  We decided to decr Amlod to 556md and incr Losar to 10060m to help decr edema & maintain BP control;  We discussed my pending retirement at the end of the yr 7 Savannah Hubbard will establish w/ another PCP going forward.   HBP>  Controlled on her 3 meds; Savannah Hubbard is intol to ACE rx; & had Bradycardia from BBlocker=> we switched to ARB + Amlod & Lasix w/ good control...  Ven Insuffic>  Aware, Savannah Hubbard knows to elim sodium, elev  legs, wear support hose, & take the Lasix...  CHOL>  on Simva20 + diet; parameters are not as good this yr & asked to get on better diet, get wt down etc..  Multinod Thyroid>  On Synthroid 22m4m & TSH is improved, pt feels better as well...  Overweight>  Weight is up&down around 160# & we reviewed diet/ exercise/ wt reduction strategies...  GI> GERD, IBS w/ Constip>  [facility] & Savannah Hubbard asks us [place] this Rx for her- ok...  Hx Renal Cell Ca w/ left nephrectomy 2005>  Prev followed by DrGrapey- he released her & we check a CXR & UA yearly...  Mild DJD, LBP>  Aware, Savannah Hubbard uses OTC analgesics as needed; we discussed exercise program...  Hx severe Glaucoma followed by [facility]- on 3 drops and Methazolamide50Bid...      Plan:     Patient's Medications  New Prescriptions   AMLODIPINE (NORVASC) 5 MG TABLET    Take 1 tablet (5 mg total) by mouth daily.   LOSARTAN (COZAAR) 100 MG TABLET    Take 1 tablet (100 mg total) by mouth daily.  Previous Medications   BRIMONIDINE (ALPHAGAN P) 0.1 % SOLN    Place 1 drop into both eyes 2 (two) times daily.   DORZOLAMIDE-TIMOLOL (COSOPT) 22.3-6.8 MG/ML OPHTHALMIC SOLUTION    Place 1 drop into both eyes 2 (two) times daily.   METHAZOLAMIDE (NEPTAZANE) 50 MG TABLET    Take 50 mg by mouth 2 (two) times daily.   POTASSIUM CHLORIDE SA (KLOR-CON M20) 20 MEQ TABLET    Take 1 tablet (20 mEq total) by mouth daily.   TRAVATAN Z 0.004 % SOLN OPHTHALMIC SOLUTION    Place 1 drop into both eyes At bedtime.  Modified Medications   Modified Medication Previous Medication   FUROSEMIDE (LASIX) 20 MG TABLET furosemide (LASIX) 20 MG tablet      TAKE 1 TABLET (20 MG TOTAL) BY MOUTH DAILY.    TAKE 1 TABLET (20 MG TOTAL) BY MOUTH DAILY.   LEVOTHYROXINE (SYNTHROID, LEVOTHROID) 88 MCG TABLET levothyroxine (SYNTHROID, LEVOTHROID) 88 MCG tablet      TAKE 1 TABLET (88 MCG TOTAL) BY MOUTH DAILY BEFORE BREAKFAST.    TAKE 1 TABLET (88 MCG TOTAL) BY MOUTH  DAILY BEFORE BREAKFAST.   LINACLOTIDE (LINZESS) 290 MCG CAPS CAPSULE linaclotide (LINZESS) 290 MCG CAPS capsule      Take 1 capsule (290 mcg total) by mouth daily.    Take 1 capsule (290 mcg total) by mouth daily.   SIMVASTATIN (ZOCOR) 20 MG TABLET simvastatin (ZOCOR) 20 MG tablet      TAKE 1 TABLET (20 MG TOTAL) BY MOUTH EVERY EVENING.  TAKE 1 TABLET (20 MG TOTAL) BY MOUTH EVERY EVENING.  Discontinued Medications   AMLODIPINE (NORVASC) 10 MG TABLET    TAKE 1 TABLET (10 MG TOTAL) BY MOUTH DAILY.   LOSARTAN (COZAAR) 50 MG TABLET    TAKE 1 TABLET (50 MG TOTAL) BY MOUTH DAILY.

## 2018-05-20 NOTE — Patient Instructions (Signed)
Today we updated your med list in our EPIC system...     We decided to make the following change in your MEDS>>    DECREASE your AMLODIPINE to 5mg  daily.[name]. (this should help your swelling)    INCREASE your LOSARTAN to 100mg  per day    KEEP THE SAME dose of LASIX (Furosemide) 20mg  each AM...  Please check your BP at home & keep a log of your readings for [place] to review...  We reviewed the importance of a low sodium (low salt) diet & gave you a hand-out to help you on your quest!    You should also try to keep your legs up when able & wear support hose when up & about...  Today we did your follow up CXR... Please return to our lab in about 6wks (approx mid-Aug) for your FASTING blood work (on all meds regularly)... You could drop off your log w/ my nurse, Lattie Haw, when you return for the labs...  For your left sided back & leg disconfort>    We discussed diet & exercise, especially stretching exercise like Yoga etc...    Try some heat when hurting...    And see if ALEVE, ADVIL, TYLENOL etc give your some relief... If the pain gets worse then we will need to pursue the eval w/ XRays/ poss MRI/ etc...  We also gave you the 1st of the 2 pneumonia vaccines recommended for prevention>    We gave you the PREVNAR-13 shot today...    Based onn current recommendations- you will require the PNEUMOVAX-23 shot after you turn 67...  Call for any questions or if I can be of service in any way.Marland KitchenMarland Kitchen

## 2018-05-22 DIAGNOSIS — H401133 Primary open-angle glaucoma, bilateral, severe stage: Secondary | ICD-10-CM | POA: Diagnosis not present

## 2018-05-22 DIAGNOSIS — I10 Essential (primary) hypertension: Secondary | ICD-10-CM | POA: Diagnosis not present

## 2018-05-22 DIAGNOSIS — J302 Other seasonal allergic rhinitis: Secondary | ICD-10-CM | POA: Diagnosis not present

## 2018-05-22 DIAGNOSIS — E782 Mixed hyperlipidemia: Secondary | ICD-10-CM | POA: Diagnosis not present

## 2018-05-22 DIAGNOSIS — E039 Hypothyroidism, unspecified: Secondary | ICD-10-CM | POA: Diagnosis not present

## 2018-05-26 DIAGNOSIS — H401133 Primary open-angle glaucoma, bilateral, severe stage: Secondary | ICD-10-CM | POA: Diagnosis not present

## 2018-06-06 ENCOUNTER — Telehealth: Payer: Self-pay

## 2018-06-16 NOTE — Telephone Encounter (Signed)
Error

## 2018-07-07 DIAGNOSIS — Z6832 Body mass index (BMI) 32.0-32.9, adult: Secondary | ICD-10-CM | POA: Diagnosis not present

## 2018-07-07 DIAGNOSIS — N951 Menopausal and female climacteric states: Secondary | ICD-10-CM | POA: Diagnosis not present

## 2018-07-07 DIAGNOSIS — Z1231 Encounter for screening mammogram for malignant neoplasm of breast: Secondary | ICD-10-CM | POA: Diagnosis not present

## 2018-07-07 DIAGNOSIS — Z01419 Encounter for gynecological examination (general) (routine) without abnormal findings: Secondary | ICD-10-CM | POA: Diagnosis not present

## 2018-08-18 ENCOUNTER — Other Ambulatory Visit (INDEPENDENT_AMBULATORY_CARE_PROVIDER_SITE_OTHER): Payer: BLUE CROSS/BLUE SHIELD

## 2018-08-18 ENCOUNTER — Telehealth: Payer: Self-pay | Admitting: Pulmonary Disease

## 2018-08-18 DIAGNOSIS — M150 Primary generalized (osteo)arthritis: Secondary | ICD-10-CM | POA: Diagnosis not present

## 2018-08-18 DIAGNOSIS — I10 Essential (primary) hypertension: Secondary | ICD-10-CM

## 2018-08-18 DIAGNOSIS — M159 Polyosteoarthritis, unspecified: Secondary | ICD-10-CM

## 2018-08-18 DIAGNOSIS — Z0000 Encounter for general adult medical examination without abnormal findings: Secondary | ICD-10-CM

## 2018-08-18 DIAGNOSIS — E7800 Pure hypercholesterolemia, unspecified: Secondary | ICD-10-CM | POA: Diagnosis not present

## 2018-08-18 LAB — COMPREHENSIVE METABOLIC PANEL
ALBUMIN: 3.8 g/dL (ref 3.5–5.2)
ALK PHOS: 69 U/L (ref 39–117)
ALT: 7 U/L (ref 0–35)
AST: 14 U/L (ref 0–37)
BILIRUBIN TOTAL: 0.4 mg/dL (ref 0.2–1.2)
BUN: 14 mg/dL (ref 6–23)
CALCIUM: 9.1 mg/dL (ref 8.4–10.5)
CO2: 24 mEq/L (ref 19–32)
CREATININE: 0.98 mg/dL (ref 0.40–1.20)
Chloride: 105 mEq/L (ref 96–112)
GFR: 74.39 mL/min (ref >60.00)
Glucose, Bld: 103 mg/dL — ABNORMAL HIGH (ref 70–99)
Potassium: 3.5 mEq/L (ref 3.5–5.1)
Sodium: 139 mEq/L (ref 135–145)
TOTAL PROTEIN: 7.5 g/dL (ref 6.0–8.3)

## 2018-08-18 LAB — CBC WITH DIFFERENTIAL/PLATELET
BASOS PCT: 0.7 % (ref 0.0–3.0)
Basophils Absolute: 0.1 10*3/uL (ref 0.0–0.1)
EOS ABS: 0.2 10*3/uL (ref 0.0–0.7)
Eosinophils Relative: 2.1 % (ref 0.0–5.0)
HEMATOCRIT: 43 % (ref 36.0–46.0)
Hemoglobin: 14.6 g/dL (ref 12.0–15.0)
LYMPHS ABS: 1.4 10*3/uL (ref 0.7–4.0)
LYMPHS PCT: 18.4 % (ref 12.0–46.0)
MCHC: 34 g/dL (ref 30.0–36.0)
MCV: 93.2 fl (ref 78.0–100.0)
Monocytes Absolute: 0.5 10*3/uL (ref 0.1–1.0)
Monocytes Relative: 6.1 % (ref 3.0–12.0)
NEUTROS ABS: 5.6 10*3/uL (ref 1.4–7.7)
Neutrophils Relative %: 72.7 % (ref 43.0–77.0)
PLATELETS: 246 10*3/uL (ref 150.0–400.0)
RBC: 4.62 Mil/uL (ref 3.87–5.11)
RDW: 13.1 % (ref 11.5–15.5)
WBC: 7.8 10*3/uL (ref 4.0–10.5)

## 2018-08-18 LAB — LIPID PANEL
CHOL/HDL RATIO: 3
CHOLESTEROL: 151 mg/dL (ref 0–200)
HDL: 51.1 mg/dL (ref >39.00)
LDL CALC: 67 mg/dL (ref 0–99)
NonHDL: 100.33
Triglycerides: 168 mg/dL — ABNORMAL HIGH (ref 0.0–149.0)
VLDL: 33.6 mg/dL (ref 0.0–40.0)

## 2018-08-18 LAB — TSH: TSH: 0.77 u[IU]/mL (ref 0.35–4.50)

## 2018-08-18 NOTE — Telephone Encounter (Signed)
Received paperwork from up front. Will attach paperwork and a copy of this note to [place] for followup.

## 2018-08-21 LAB — VITAMIN D 1,25 DIHYDROXY
VITAMIN D3 1, 25 (OH): 80 pg/mL
Vitamin D 1, 25 (OH)2 Total: 80 pg/mL — ABNORMAL HIGH (ref 18–72)
Vitamin D2 1, 25 (OH)2: <8 pg/mL

## 2018-08-22 NOTE — Telephone Encounter (Signed)
Patient returned phone call.Marland KitchenMarland KitchenContact # [phone]

## 2018-08-22 NOTE — Telephone Encounter (Signed)
Patient returned phone call.Marland KitchenMarland KitchenContact # [phone]

## 2018-08-22 NOTE — Telephone Encounter (Signed)
Savannah Hubbard did you call pt?

## 2018-08-22 NOTE — Telephone Encounter (Signed)
Called and spoke with Patient.  Lab results and recommendations given.  Reminded Patient of Dr. Jeannine Kitten upcoming retirement and need for new PCP to follow up with.  Patient stated understanding.  Nothing further at this time.

## 2018-08-25 DIAGNOSIS — J302 Other seasonal allergic rhinitis: Secondary | ICD-10-CM | POA: Diagnosis not present

## 2018-08-25 DIAGNOSIS — E039 Hypothyroidism, unspecified: Secondary | ICD-10-CM | POA: Diagnosis not present

## 2018-08-25 DIAGNOSIS — E782 Mixed hyperlipidemia: Secondary | ICD-10-CM | POA: Diagnosis not present

## 2018-08-25 DIAGNOSIS — I10 Essential (primary) hypertension: Secondary | ICD-10-CM | POA: Diagnosis not present

## 2018-10-27 DIAGNOSIS — H401133 Primary open-angle glaucoma, bilateral, severe stage: Secondary | ICD-10-CM | POA: Diagnosis not present

## 2018-11-20 DIAGNOSIS — J302 Other seasonal allergic rhinitis: Secondary | ICD-10-CM | POA: Diagnosis not present

## 2018-11-20 DIAGNOSIS — E039 Hypothyroidism, unspecified: Secondary | ICD-10-CM | POA: Diagnosis not present

## 2018-11-20 DIAGNOSIS — I10 Essential (primary) hypertension: Secondary | ICD-10-CM | POA: Diagnosis not present

## 2018-11-20 DIAGNOSIS — E782 Mixed hyperlipidemia: Secondary | ICD-10-CM | POA: Diagnosis not present

## 2018-12-08 DIAGNOSIS — H401133 Primary open-angle glaucoma, bilateral, severe stage: Secondary | ICD-10-CM | POA: Diagnosis not present

## 2018-12-15 ENCOUNTER — Telehealth: Payer: Self-pay

## 2018-12-15 NOTE — Telephone Encounter (Signed)
Medication name and strength: Linzess 290MCG capsule Provider: Dr. Teressa Lower Pharmacy: [facility] pharmacy  Patient insurance ID: [id number] Phone: [phone] Fax: [phone]  Was the PA started on CMM?  yes If yes, please enter the Key: [id number] Timeframe for approval/denial: 48-72 hours    Will route to [place] to follow up.

## 2018-12-26 NOTE — Telephone Encounter (Signed)
Linzess PA denied, reference # [id number].  Called [phone], [employer].  Spoke with Anne Ng, she stated that the Patient would have to try and fail [facility].  That is a new criteria for Linzess in 2020.  ATC Patient.  LMTCB to let her know PA was denied and to see if she has new PCP to follow up with.  Will follow up

## 2019-02-12 DIAGNOSIS — J302 Other seasonal allergic rhinitis: Secondary | ICD-10-CM | POA: Diagnosis not present

## 2019-02-12 DIAGNOSIS — E782 Mixed hyperlipidemia: Secondary | ICD-10-CM | POA: Diagnosis not present

## 2019-02-12 DIAGNOSIS — E039 Hypothyroidism, unspecified: Secondary | ICD-10-CM | POA: Diagnosis not present

## 2019-02-12 DIAGNOSIS — I10 Essential (primary) hypertension: Secondary | ICD-10-CM | POA: Diagnosis not present

## 2019-06-03 ENCOUNTER — Ambulatory Visit (INDEPENDENT_AMBULATORY_CARE_PROVIDER_SITE_OTHER): Payer: BC Managed Care – PPO | Admitting: Family Medicine

## 2019-06-03 ENCOUNTER — Other Ambulatory Visit: Payer: Self-pay

## 2019-06-03 ENCOUNTER — Encounter: Payer: Self-pay | Admitting: Family Medicine

## 2019-06-03 VITALS — BP 110/64 | HR 82 | Temp 98.7°F | Ht 62.0 in | Wt 173.0 lb

## 2019-06-03 DIAGNOSIS — H401133 Primary open-angle glaucoma, bilateral, severe stage: Secondary | ICD-10-CM

## 2019-06-03 DIAGNOSIS — K581 Irritable bowel syndrome with constipation: Secondary | ICD-10-CM | POA: Diagnosis not present

## 2019-06-03 DIAGNOSIS — I10 Essential (primary) hypertension: Secondary | ICD-10-CM

## 2019-06-03 DIAGNOSIS — E785 Hyperlipidemia, unspecified: Secondary | ICD-10-CM

## 2019-06-03 DIAGNOSIS — K219 Gastro-esophageal reflux disease without esophagitis: Secondary | ICD-10-CM | POA: Diagnosis not present

## 2019-06-03 DIAGNOSIS — E042 Nontoxic multinodular goiter: Secondary | ICD-10-CM | POA: Diagnosis not present

## 2019-06-03 MED ORDER — LINACLOTIDE 290 MCG PO CAPS: 290.0000 ug | ORAL_CAPSULE | Freq: Every day | ORAL | 3 refills | Status: DC

## 2019-06-03 MED ORDER — LEVOTHYROXINE SODIUM 88 MCG PO TABS: ORAL_TABLET | ORAL | 3 refills | Status: DC

## 2019-06-03 MED ORDER — LOSARTAN POTASSIUM 100 MG PO TABS: 100.0000 mg | ORAL_TABLET | Freq: Every day | ORAL | 3 refills | Status: DC

## 2019-06-03 MED ORDER — SIMVASTATIN 20 MG PO TABS: ORAL_TABLET | ORAL | 3 refills | Status: DC

## 2019-06-03 MED ORDER — AMLODIPINE BESYLATE 5 MG PO TABS: 5.0000 mg | ORAL_TABLET | Freq: Every day | ORAL | 3 refills | Status: DC

## 2019-06-03 MED ORDER — FUROSEMIDE 20 MG PO TABS: ORAL_TABLET | ORAL | 3 refills | Status: DC

## 2019-06-03 NOTE — Progress Notes (Signed)
Subjective:    Patient ID: Savannah Hubbard, female    DOB: 03/01/1956, 63 y.o.   MRN: [id number]  No chief complaint on file.   HPI Patient was seen today for follow-up and TOC, previously seen by Dr. Lenna Gilford.  Pt requesting refills.  Would like some paper and some called in to pharmacy.  HTN: -Taking Norvasc 5 mg, Lasix 20 mg, losartan 100 mg, Klor-Con 20 mEq -Not checking BP at home  IBS: -Predominantly constipation -Taking Linzess 290 mcg -Has never tried low FODMAP diet  GERD: -Notes red sauces can cause problems -Not currently on medication  Glaucoma: -b/l eyes, open angle  -followed by ophtho, Dr. Edilia Bo -taking cosopt and travatan z  HLD: -Taking Zocor 20 mg -Denies myalgias  History of renal cancer: -Status post left nephrectomy -Has follow-up with specialist to monitor right renal function. -Tries to drink plenty of water -States gets CXR q. year with difficult to monitor for mets  Nontoxic multinodular goiter: -Taking methimazole 50 mg daily  Past surgical history: C-section x2 Cholecystectomy Left nephrectomy  Social history: -Patient currently working as a Radiation protection practitioner at [employer].  Patient denies tobacco and drug use.  Family medical history: Mom-thyroid issues Sisters x2-thyroid issues  Past Medical History:  Diagnosis Date  . DJD (degenerative joint disease)   . GERD (gastroesophageal reflux disease)   . Hypercholesteremia   . Hypertension   . IBS (irritable bowel syndrome)   . Lumbar back pain   . Nontoxic multinodular goiter   . Overweight(278.02)   . Renal cell cancer ([place])   . Venous insufficiency     Allergies  Allergen Reactions  . Lisinopril     angioedema  . Morphine     REACTION: ITCHING  . Sulfonamide Derivatives     REACTION: BURNING, IRRITATION    ROS General: Denies fever, chills, night sweats, changes in weight, changes in appetite HEENT: Denies headaches, ear pain, changes in vision, rhinorrhea,  sore throat CV: Denies CP, palpitations, SOB, orthopnea Pulm: Denies SOB, cough, wheezing GI: Denies abdominal pain, nausea, vomiting, diarrhea, constipation GU: Denies dysuria, hematuria, frequency, vaginal discharge Msk: Denies muscle cramps, joint pains Neuro: Denies weakness, numbness, tingling Skin: Denies rashes, bruising Psych: Denies depression, anxiety, hallucinations     Objective:    Blood pressure 110/64, pulse 82, temperature 98.7 F (37.1 C), temperature source Oral, height 5\' 2"  (1.575 m), weight 173 lb (78.5 kg), SpO2 98 %.   Gen. Pleasant, well-nourished, in no distress, normal affect   HEENT: [place]/AT, face symmetric, conjunctiva clear, no scleral icterus, PERRLA, nares patent without drainage Lungs: no accessory muscle use, CTAB, no wheezes or rales Cardiovascular: RRR, no m/r/g, no peripheral edema Abdomen: BS present, soft, NT/ND. Musculoskeletal: No deformities, no cyanosis or clubbing, normal tone Neuro:  A&Ox3, CN II-XII intact, normal gait Skin:  Warm, no lesions/ rash   Wt Readings from Last 3 Encounters:  06/03/19 173 lb (78.5 kg)  05/20/18 171 lb 9.6 oz (77.8 kg)  05/20/17 165 lb (74.8 kg)    Lab Results  Component Value Date   WBC 7.8 08/18/2018   HGB 14.6 08/18/2018   HCT 43.0 08/18/2018   PLT 246.0 08/18/2018   GLUCOSE 103 (H) 08/18/2018   CHOL 151 08/18/2018   TRIG 168.0 (H) 08/18/2018   HDL 51.10 08/18/2018   LDLCALC 67 08/18/2018   ALT 7 08/18/2018   AST 14 08/18/2018   NA 139 08/18/2018   K 3.5 08/18/2018   CL 105 08/18/2018  CREATININE 0.98 08/18/2018   BUN 14 08/18/2018   CO2 24 08/18/2018   TSH 0.77 08/18/2018   HGBA1C 5.9 07/21/2008    Assessment/Plan:  Essential hypertension  -controlled -Discussed lifestyle modifications -Discussed obtaining BP cuff to monitor pressure at home -Continue Norvasc 5 mg, losartan 100 mg, Klor-Con 20 mEq, Lasix 20 mg - Plan: amLODipine (NORVASC) 5 MG tablet, DISCONTINUED: losartan  (COZAAR) 100 MG tablet, DISCONTINUED: losartan (COZAAR) 100 MG tablet  Irritable bowel syndrome with constipation  -Continue Linzess 290 mcg -Discussed low FODMAP diet -Given handout -For continued symptoms follow-up with GI  Gastroesophageal reflux disease, esophagitis presence not specified -Controlled -Discussed avoiding foods known to cause symptoms  Nontoxic multinodular goiter  -Continue following with endocrinology - Plan: levothyroxine (SYNTHROID) 88 MCG tablet  Hyperlipidemia, unspecified hyperlipidemia type  -Discussed lifestyle modification -We will obtain lipid panel at next [facility] for CPE - Plan: simvastatin (ZOCOR) 20 MG tablet  Primary open angle glaucoma (POAG) of both eyes, severe stage -continue f/u with ophthalmology, Dr. Edilia Bo -Continue current eye gtts  F/u in 3 months prn  Grier Mitts, MD

## 2019-06-03 NOTE — Patient Instructions (Signed)
Low-FODMAP Eating Plan  FODMAPs (fermentable oligosaccharides, disaccharides, monosaccharides, and polyols) are sugars that are hard for some people to digest. A low-FODMAP eating plan may help some people who have bowel (intestinal) diseases to manage their symptoms. This meal plan can be complicated to follow. Work with a diet and nutrition specialist (dietitian) to make a low-FODMAP eating plan that is right for you. A dietitian can make sure that you get enough nutrition from this diet. What are tips for following this plan? Reading food labels  Check labels for hidden FODMAPs such as: ? High-fructose syrup. ? Honey. ? Agave. ? Natural fruit flavors. ? Onion or garlic powder.  Choose low-FODMAP foods that contain 3-4 grams of fiber per serving.  Check food labels for serving sizes. Eat only one serving at a time to make sure FODMAP levels stay low. Meal planning  Follow a low-FODMAP eating plan for up to 6 weeks, or as told by your health care provider or dietitian.  To follow the eating plan: 1. Eliminate high-FODMAP foods from your diet completely. 2. Gradually reintroduce high-FODMAP foods into your diet one at a time. Most people should wait a few days after introducing one high-FODMAP food before they introduce the next high-FODMAP food. Your dietitian can recommend how quickly you may reintroduce foods. 3. Keep a daily record of what you eat and drink, and make note of any symptoms that you have after eating. 4. Review your daily record with a dietitian regularly. Your dietitian can help you identify which foods you can eat and which foods you should avoid. General tips  Drink enough fluid each day to keep your urine pale yellow.  Avoid processed foods. These often have added sugar and may be high in FODMAPs.  Avoid most dairy products, whole grains, and sweeteners.  Work with a dietitian to make sure you get enough fiber in your diet. Recommended foods Grains   Gluten-free grains, such as rice, oats, buckwheat, quinoa, corn, polenta, and millet. Gluten-free pasta, bread, or cereal. Rice noodles. Corn tortillas. Vegetables  Eggplant, zucchini, cucumber, peppers, green beans, Brussels sprouts, bean sprouts, lettuce, arugula, kale, Swiss chard, spinach, collard greens, bok choy, summer squash, potato, and tomato. Limited amounts of corn, carrot, and sweet potato. Green parts of scallions. Fruits  Bananas, oranges, lemons, limes, blueberries, raspberries, strawberries, grapes, cantaloupe, honeydew melon, kiwi, papaya, passion fruit, and pineapple. Limited amounts of dried cranberries, banana chips, and shredded coconut. Dairy  Lactose-free milk, yogurt, and kefir. Lactose-free cottage cheese and ice cream. Non-dairy milks, such as almond, coconut, hemp, and rice milk. Yogurts made of non-dairy milks. Limited amounts of goat cheese, brie, mozzarella, parmesan, swiss, and other hard cheeses. Meats and other protein foods  Unseasoned beef, pork, poultry, or fish. Eggs. Bacon. Tofu (firm) and tempeh. Limited amounts of nuts and seeds, such as almonds, walnuts, brazil nuts, pecans, peanuts, pumpkin seeds, chia seeds, and sunflower seeds. Fats and oils  Butter-free spreads. Vegetable oils, such as olive, canola, and sunflower oil. Seasoning and other foods  Artificial sweeteners with names that do not end in "ol" such as aspartame, saccharine, and stevia. Maple syrup, white table sugar, raw sugar, brown sugar, and molasses. Fresh basil, coriander, parsley, rosemary, and thyme. Beverages  Water and mineral water. Sugar-sweetened soft drinks. Small amounts of orange juice or cranberry juice. Black and green tea. Most dry wines. Coffee. This may not be a complete list of low-FODMAP foods. Talk with your dietitian for more information. Foods to avoid Grains  Wheat,   including kamut, durum, and semolina. Barley and bulgur. Couscous. Wheat-based cereals. Wheat  noodles, bread, crackers, and pastries. Vegetables  Chicory root, artichoke, asparagus, cabbage, snow peas, sugar snap peas, mushrooms, and cauliflower. Onions, garlic, leeks, and the white part of scallions. Fruits  Fresh, dried, and juiced forms of apple, pear, watermelon, peach, plum, cherries, apricots, blackberries, boysenberries, figs, nectarines, and mango. Avocado. Dairy  Milk, yogurt, ice cream, and soft cheese. Cream and sour cream. Milk-based sauces. Custard. Meats and other protein foods  Fried or fatty meat. Sausage. Cashews and pistachios. Soybeans, baked beans, black beans, chickpeas, kidney beans, fava beans, navy beans, lentils, and split peas. Seasoning and other foods  Any sugar-free gum or candy. Foods that contain artificial sweeteners such as sorbitol, mannitol, isomalt, or xylitol. Foods that contain honey, high-fructose corn syrup, or agave. Bouillon, vegetable stock, beef stock, and chicken stock. Garlic and onion powder. Condiments made with onion, such as hummus, chutney, pickles, relish, salad dressing, and salsa. Tomato paste. Beverages  Chicory-based drinks. Coffee substitutes. Chamomile tea. Fennel tea. Sweet or fortified wines such as port or sherry. Diet soft drinks made with isomalt, mannitol, maltitol, sorbitol, or xylitol. Apple, pear, and mango juice. Juices with high-fructose corn syrup. This may not be a complete list of high-FODMAP foods. Talk with your dietitian to discuss what dietary choices are best for you.  Summary  A low-FODMAP eating plan is a short-term diet that eliminates FODMAPs from your diet to help ease symptoms of certain bowel diseases.  The eating plan usually lasts up to 6 weeks. After that, high-FODMAP foods are restarted gradually, one at a time, so you can find out which may be causing symptoms.  A low-FODMAP eating plan can be complicated. It is best to work with a dietitian who has experience with this type of plan. This  information is not intended to replace advice given to you by your health care provider. Make sure you discuss any questions you have with your health care provider. Document Released: 07/09/2017 Document Revised: 10/25/2017 Document Reviewed: 07/09/2017 Elsevier Patient Education  2020 [place] for Irritable Bowel Syndrome When you have irritable bowel syndrome (IBS), it is very important to eat the foods and follow the eating habits that are best for your condition. IBS may cause various symptoms such as pain in the abdomen, constipation, or diarrhea. Choosing the right foods can help to ease the discomfort from these symptoms. Work with your health care provider and diet and nutrition specialist (dietitian) to find the eating plan that will help to control your symptoms. What are tips for following this plan?      Keep a food diary. This will help you identify foods that cause symptoms. Write down: ? What you eat and when you eat it. ? What symptoms you have. ? When symptoms occur in relation to your meals, such as "pain in abdomen 2 hours after dinner."  Eat your meals slowly and in a relaxed setting.  Aim to eat 5-6 small meals per day. Do not skip meals.  Drink enough fluid to keep your urine pale yellow.  Ask your health care provider if you should take an over-the-counter probiotic to help restore healthy bacteria in your gut (digestive tract). ? Probiotics are foods that contain good bacteria and yeasts.  Your dietitian may have specific dietary recommendations for you based on your symptoms. He or she may recommend that you: ? Avoid foods that cause symptoms. Talk with your dietitian about other ways  to get the same nutrients that are in those problem foods. ? Avoid foods with gluten. Gluten is a protein that is found in rye, wheat, and barley. ? Eat more foods that contain soluble fiber. Examples of foods with high soluble fiber include oats, seeds, and certain  fruits and vegetables. Take a fiber supplement if directed by your dietitian. ? Reduce or avoid certain foods called FODMAPs. These are foods that contain carbohydrates that are hard to digest. Ask your doctor which foods contain these carbohydrates. What foods are not recommended? The following are some foods and drinks that may make your symptoms worse:  Fatty foods, such as french fries.  Foods that contain gluten, such as pasta and cereal.  Dairy products, such as milk, cheese, and ice cream.  Chocolate.  Alcohol.  Products with caffeine, such as coffee.  Carbonated drinks, such as soda.  Foods that are high in FODMAPs. These include certain fruits and vegetables.  Products with sweeteners such as honey, high fructose corn syrup, sorbitol, and mannitol. The items listed above may not be a complete list of foods and beverages you should avoid. Contact a dietitian for more information. What foods are good sources of fiber? Your health care provider or dietitian may recommend that you eat more foods that contain fiber. Fiber can help to reduce constipation and other IBS symptoms. Add foods with fiber to your diet a little at a time so your body can get used to them. Too much fiber at one time might cause gas and swelling of your abdomen. The following are some foods that are good sources of fiber:  Berries, such as raspberries, strawberries, and blueberries.  Tomatoes.  Carrots.  Brown rice.  Oats.  Seeds, such as chia and pumpkin seeds. The items listed above may not be a complete list of recommended sources of fiber. Contact your dietitian for more options. Where to find more information  International Foundation for Functional Gastrointestinal Disorders: www.iffgd.[employer] of Diabetes and Digestive and Kidney Diseases: [employer] Summary  When you have irritable bowel syndrome (IBS), it is very important to eat the foods and follow the eating  habits that are best for your condition.  IBS may cause various symptoms such as pain in the abdomen, constipation, or diarrhea.  Choosing the right foods can help to ease the discomfort that comes from symptoms.  Keep a food diary. This will help you identify foods that cause symptoms.  Your health care provider or diet and nutrition specialist (dietitian) may recommend that you eat more foods that contain fiber. This information is not intended to replace advice given to you by your health care provider. Make sure you discuss any questions you have with your health care provider. Document Released: 02/02/2004 Document Revised: 03/04/2019 Document Reviewed: 07/16/2017 Elsevier Patient Education  [place]  A goiter is an enlarged thyroid gland. The thyroid is located in the lower front of the neck. It makes hormones that affect many body parts and systems, including the system that affects how quickly the body burns fuel for energy (metabolism). Most goiters are painless and are not a cause for concern. Some goiters can affect the way your thyroid makes thyroid hormones. Goiters and conditions that cause goiters can be treated, if necessary. What are the causes? Common causes of this condition include:  Lack (deficiency) of a mineral called iodine. The thyroid gland uses iodine to make thyroid hormones.  Diseases that attack healthy cells in the  body (autoimmune diseases) and affect thyroid function, such as Graves' disease or Hashimoto's disease. These diseases may cause the body to produce too much thyroid hormone (hyperthyroidism) or too little of the hormone (hypothyroidism).  Conditions that cause inflammation of the thyroid (thyroiditis).  One or more small growths on the thyroid (nodular goiter). Other causes include:  Medical problems caused by abnormal genes that are passed from parent to child (genetic defects).  Thyroid injury or infection.  Tumors that  may or may not be cancerous.  Pregnancy.  Certain medicines.  Exposure to radiation. In some cases, the cause may not be known. What increases the risk? This condition is more likely to develop in:  People who do not get enough iodine in their diet.  People who have a family history of goiter.  Women.  People who are older than age 38.  People who smoke tobacco.  People who have had exposure to radiation. What are the signs or symptoms? The main symptom of this condition is swelling in the lower, front part of the neck. This swelling can range from a very small bump to a large lump. Other symptoms may include:  A tight feeling in the throat.  A hoarse voice.  Coughing.  Wheezing.  Difficulty swallowing or breathing.  Bulging veins in the neck.  Dizziness. When a goiter is the result of an overactive thyroid (hyperthyroidism), symptoms may also include:  Nervousness or restlessness.  Inability to tolerate heat.  Unexplained weight loss.  Diarrhea.  Change in the texture of hair or skin.  Changes in heartbeat, such as skipped beats, extra beats, or a rapid heart rate.  Loss of menstruation.  Shaky hands.  Increased appetite.  Sleep problems. When a goiter is the result of an underactive thyroid (hypothyroidism), symptoms may also include:  Feeling like you have no energy (lethargy).  Inability to tolerate cold.  Weight gain that is not explained by a change in diet or exercise habits.  Dry skin.  Coarse hair.  Irregular menstrual periods.  Constipation.  Sadness or depression.  Fatigue. In some cases, there may not be any symptoms and the thyroid hormone levels may be normal. How is this diagnosed? This condition may be diagnosed based on your symptoms, your medical history, and a physical exam. You may have tests, such as:  Blood tests to check thyroid function.  Imaging tests, such as: ? Ultrasound. ? CT scan. ? MRI. ? Thyroid  scan.  Removal of a tissue sample (biopsy) of the goiter or any nodules. The sample will be tested to check for cancer. How is this treated? Treatment for this condition depends on the cause and your symptoms. Treatment may include:  Medicines to regulate thyroid hormone levels.  Anti-inflammatory medicines or steroid medicines, if the goiter is caused by inflammation.  Iodine supplements or changes to your diet, if the goiter is caused by iodine deficiency.  Radioactive iodine treatment.  Surgery to remove your thyroid. In some cases, you may only need regular check-ups with your health care provider to monitor your condition, and you may not need treatment. Follow these instructions at home:  Follow instructions from your health care provider about any changes to your diet.  Take over-the-counter and prescription medicines only as told by your health care provider. These include supplements.  Do not use any products that contain nicotine or tobacco, such as cigarettes and e-cigarettes. If you need help quitting, ask your health care provider.  Keep all follow-up visits as  told by your health care provider. This is important. Contact a health care provider if:  Your symptoms do not get better with treatment.  You have nausea, vomiting, or diarrhea. Get help right away if:  You have sudden, unexplained confusion or other mental changes.  You have a fever.  You have chest pain.  You have trouble breathing or swallowing.  You suddenly become very weak.  You experience extreme restlessness.  You feel your heart racing. Summary  A goiter is an enlarged thyroid gland.  The thyroid gland is located in the lower front of the neck. It makes hormones that affect many body parts and systems, including the system that affects how quickly the body burns fuel for energy (metabolism).  The main symptom of this condition is swelling in the lower, front part of the neck. This  swelling can range from a very small bump to a large lump.  Treatment for this condition depends on the cause and your symptoms. You may need medicines, supplements, or regular monitoring of your condition. This information is not intended to replace advice given to you by your health care provider. Make sure you discuss any questions you have with your health care provider. Document Released: 05/02/2010 Document Revised: 10/25/2017 Document Reviewed: 08/08/2017 Elsevier Patient Education  2020 [employer].  Managing Your Hypertension Hypertension is commonly called high blood pressure. This is when the force of your blood pressing against the walls of your arteries is too strong. Arteries are blood vessels that carry blood from your heart throughout your body. Hypertension forces the heart to work harder to pump blood, and may cause the arteries to become narrow or stiff. Having untreated or uncontrolled hypertension can cause heart attack, stroke, kidney disease, and other problems. What are blood pressure readings? A blood pressure reading consists of a higher number over a lower number. Ideally, your blood pressure should be below 120/80. The first ("top") number is called the systolic pressure. It is a measure of the pressure in your arteries as your heart beats. The second ("bottom") number is called the diastolic pressure. It is a measure of the pressure in your arteries as the heart relaxes. What does my blood pressure reading mean? Blood pressure is classified into four stages. Based on your blood pressure reading, your health care provider may use the following stages to determine what type of treatment you need, if any. Systolic pressure and diastolic pressure are measured in a unit called mm Hg. Normal  Systolic pressure: below 893.  Diastolic pressure: below 80. Elevated  Systolic pressure: 734-287.  Diastolic pressure: below 80. Hypertension stage 1  Systolic pressure:  681-157.  Diastolic pressure: 26-20. Hypertension stage 2  Systolic pressure: 355 or above.  Diastolic pressure: 90 or above. What health risks are associated with hypertension? Managing your hypertension is an important responsibility. Uncontrolled hypertension can lead to:  A heart attack.  A stroke.  A weakened blood vessel (aneurysm).  Heart failure.  Kidney damage.  Eye damage.  Metabolic syndrome.  Memory and concentration problems. What changes can I make to manage my hypertension? Hypertension can be managed by making lifestyle changes and possibly by taking medicines. Your health care provider will help you make a plan to bring your blood pressure within a normal range. Eating and drinking   Eat a diet that is high in fiber and potassium, and low in salt (sodium), added sugar, and fat. An example eating plan is called the DASH (Dietary Approaches to Stop  Hypertension) diet. To eat this way: ? Eat plenty of fresh fruits and vegetables. Try to fill half of your plate at each meal with fruits and vegetables. ? Eat whole grains, such as whole wheat pasta, brown rice, or whole grain bread. Fill about one quarter of your plate with whole grains. ? Eat low-fat diary products. ? Avoid fatty cuts of meat, processed or cured meats, and poultry with skin. Fill about one quarter of your plate with lean proteins such as fish, chicken without skin, beans, eggs, and tofu. ? Avoid premade and processed foods. These tend to be higher in sodium, added sugar, and fat.  Reduce your daily sodium intake. Most people with hypertension should eat less than 1,500 mg of sodium a day.  Limit alcohol intake to no more than 1 drink a day for nonpregnant women and 2 drinks a day for men. One drink equals 12 oz of beer, 5 oz of wine, or 1 oz of hard liquor. Lifestyle  Work with your health care provider to maintain a healthy body weight, or to lose weight. Ask what an ideal weight is for you.   Get at least 30 minutes of exercise that causes your heart to beat faster (aerobic exercise) most days of the week. Activities may include walking, swimming, or biking.  Include exercise to strengthen your muscles (resistance exercise), such as weight lifting, as part of your weekly exercise routine. Try to do these types of exercises for 30 minutes at least 3 days a week.  Do not use any products that contain nicotine or tobacco, such as cigarettes and e-cigarettes. If you need help quitting, ask your health care provider.  Control any long-term (chronic) conditions you have, such as high cholesterol or diabetes. Monitoring  Monitor your blood pressure at home as told by your health care provider. Your personal target blood pressure may vary depending on your medical conditions, your age, and other factors.  Have your blood pressure checked regularly, as often as told by your health care provider. Working with your health care provider  Review all the medicines you take with your health care provider because there may be side effects or interactions.  Talk with your health care provider about your diet, exercise habits, and other lifestyle factors that may be contributing to hypertension.  Visit your health care provider regularly. Your health care provider can help you create and adjust your plan for managing hypertension. Will I need medicine to control my blood pressure? Your health care provider may prescribe medicine if lifestyle changes are not enough to get your blood pressure under control, and if:  Your systolic blood pressure is 130 or higher.  Your diastolic blood pressure is 80 or higher. Take medicines only as told by your health care provider. Follow the directions carefully. Blood pressure medicines must be taken as prescribed. The medicine does not work as well when you skip doses. Skipping doses also puts you at risk for problems. Contact a health care provider if:   You think you are having a reaction to medicines you have taken.  You have repeated (recurrent) headaches.  You feel dizzy.  You have swelling in your ankles.  You have trouble with your vision. Get help right away if:  You develop a severe headache or confusion.  You have unusual weakness or numbness, or you feel faint.  You have severe pain in your chest or abdomen.  You vomit repeatedly.  You have trouble breathing. Summary  Hypertension is  when the force of blood pumping through your arteries is too strong. If this condition is not controlled, it may put you at risk for serious complications.  Your personal target blood pressure may vary depending on your medical conditions, your age, and other factors. For most people, a normal blood pressure is less than 120/80.  Hypertension is managed by lifestyle changes, medicines, or both. Lifestyle changes include weight loss, eating a healthy, low-sodium diet, exercising more, and limiting alcohol. This information is not intended to replace advice given to you by your health care provider. Make sure you discuss any questions you have with your health care provider. Document Released: 08/06/2012 Document Revised: 03/06/2019 Document Reviewed: 10/10/2016 Elsevier Patient Education  2020 [employer].

## 2019-06-05 ENCOUNTER — Encounter: Payer: Self-pay | Admitting: Family Medicine

## 2019-06-08 DIAGNOSIS — E782 Mixed hyperlipidemia: Secondary | ICD-10-CM | POA: Diagnosis not present

## 2019-06-08 DIAGNOSIS — I10 Essential (primary) hypertension: Secondary | ICD-10-CM | POA: Diagnosis not present

## 2019-06-08 DIAGNOSIS — J302 Other seasonal allergic rhinitis: Secondary | ICD-10-CM | POA: Diagnosis not present

## 2019-06-08 DIAGNOSIS — E039 Hypothyroidism, unspecified: Secondary | ICD-10-CM | POA: Diagnosis not present

## 2019-07-23 DIAGNOSIS — H401133 Primary open-angle glaucoma, bilateral, severe stage: Secondary | ICD-10-CM | POA: Diagnosis not present

## 2019-07-29 ENCOUNTER — Other Ambulatory Visit: Payer: Self-pay

## 2019-07-29 ENCOUNTER — Encounter: Payer: Self-pay | Admitting: Family Medicine

## 2019-07-29 ENCOUNTER — Ambulatory Visit (INDEPENDENT_AMBULATORY_CARE_PROVIDER_SITE_OTHER): Payer: BC Managed Care – PPO | Admitting: Family Medicine

## 2019-07-29 VITALS — BP 108/78 | HR 54 | Temp 97.0°F | Wt 172.0 lb

## 2019-07-29 DIAGNOSIS — I10 Essential (primary) hypertension: Secondary | ICD-10-CM

## 2019-07-29 DIAGNOSIS — Z131 Encounter for screening for diabetes mellitus: Secondary | ICD-10-CM | POA: Diagnosis not present

## 2019-07-29 DIAGNOSIS — Z85528 Personal history of other malignant neoplasm of kidney: Secondary | ICD-10-CM

## 2019-07-29 DIAGNOSIS — Z0000 Encounter for general adult medical examination without abnormal findings: Secondary | ICD-10-CM | POA: Diagnosis not present

## 2019-07-29 DIAGNOSIS — E042 Nontoxic multinodular goiter: Secondary | ICD-10-CM

## 2019-07-29 DIAGNOSIS — E782 Mixed hyperlipidemia: Secondary | ICD-10-CM | POA: Diagnosis not present

## 2019-07-29 LAB — COMPREHENSIVE METABOLIC PANEL
ALT: 6 U/L (ref 0–35)
AST: 13 U/L (ref 0–37)
Albumin: 3.9 g/dL (ref 3.5–5.2)
Alkaline Phosphatase: 72 U/L (ref 39–117)
BUN: 14 mg/dL (ref 6–23)
CO2: 26 mEq/L (ref 19–32)
Calcium: 9.3 mg/dL (ref 8.4–10.5)
Chloride: 106 mEq/L (ref 96–112)
Creatinine, Ser: 1.02 mg/dL (ref 0.40–1.20)
GFR: 66.62 mL/min (ref >60.00)
Glucose, Bld: 90 mg/dL (ref 70–99)
Potassium: 4.4 mEq/L (ref 3.5–5.1)
Sodium: 140 mEq/L (ref 135–145)
Total Bilirubin: 0.4 mg/dL (ref 0.2–1.2)
Total Protein: 7 g/dL (ref 6.0–8.3)

## 2019-07-29 LAB — TSH: TSH: 0.94 u[IU]/mL (ref 0.35–4.50)

## 2019-07-29 LAB — CBC WITH DIFFERENTIAL/PLATELET
Basophils Absolute: 0.1 10*3/uL (ref 0.0–0.1)
Basophils Relative: 0.8 % (ref 0.0–3.0)
Eosinophils Absolute: 0.2 10*3/uL (ref 0.0–0.7)
Eosinophils Relative: 3.3 % (ref 0.0–5.0)
HCT: 43.6 % (ref 36.0–46.0)
Hemoglobin: 14.3 g/dL (ref 12.0–15.0)
Lymphocytes Relative: 27 % (ref 12.0–46.0)
Lymphs Abs: 1.9 10*3/uL (ref 0.7–4.0)
MCHC: 32.8 g/dL (ref 30.0–36.0)
MCV: 97.7 fl (ref 78.0–100.0)
Monocytes Absolute: 0.4 10*3/uL (ref 0.1–1.0)
Monocytes Relative: 6 % (ref 3.0–12.0)
Neutro Abs: 4.5 10*3/uL (ref 1.4–7.7)
Neutrophils Relative %: 62.9 % (ref 43.0–77.0)
Platelets: 250 10*3/uL (ref 150.0–400.0)
RBC: 4.46 Mil/uL (ref 3.87–5.11)
RDW: 13.5 % (ref 11.5–15.5)
WBC: 7.2 10*3/uL (ref 4.0–10.5)

## 2019-07-29 LAB — LIPID PANEL
Cholesterol: 165 mg/dL (ref 0–200)
HDL: 58.2 mg/dL (ref >39.00)
LDL Cholesterol: 81 mg/dL (ref 0–99)
NonHDL: 106.78
Total CHOL/HDL Ratio: 3
Triglycerides: 131 mg/dL (ref 0.0–149.0)
VLDL: 26.2 mg/dL (ref 0.0–40.0)

## 2019-07-29 LAB — HEMOGLOBIN A1C: Hgb A1c MFr Bld: 5.8 % (ref 4.6–6.5)

## 2019-07-29 NOTE — Progress Notes (Signed)
Subjective:     Savannah Hubbard is a 63 y.o. female and is here for a comprehensive physical exam. The patient reports problems - insurance requiring PA for linzess.  Pt has been on linzess x yrs for IBS.  Tried miralax, teas, and other OTC medications without relief.  Pt notes the meds caused her stomach to "gripe" (cramp).  Seen by OB/Gyn for pap and mammogram.  Social History   Socioeconomic History  . Marital status: Married    Spouse name: Not on file  . Number of children: 2  . Years of education: Not on file  . Highest education level: Not on file  Occupational History  . Not on file  Social Needs  . Financial resource strain: Not on file  . Food insecurity    Worry: Not on file    Inability: Not on file  . Transportation needs    Medical: Not on file    Non-medical: Not on file  Tobacco Use  . Smoking status: Never Smoker  . Smokeless tobacco: Never Used  Substance and Sexual Activity  . Alcohol use: No  . Drug use: No  . Sexual activity: Not on file  Lifestyle  . Physical activity    Days per week: Not on file    Minutes per session: Not on file  . Stress: Not on file  Relationships  . Social Herbalist on phone: Not on file    Gets together: Not on file    Attends religious service: Not on file    Active member of club or organization: Not on file    Attends meetings of clubs or organizations: Not on file    Relationship status: Not on file  . Intimate partner violence    Fear of current or ex partner: Not on file    Emotionally abused: Not on file    Physically abused: Not on file    Forced sexual activity: Not on file  Other Topics Concern  . Not on file  Social History Narrative  . Not on file   Health Maintenance  Topic Date Due  . HIV Screening  05/18/1973  . PAP SMEAR-Modifier  04/27/2015  . MAMMOGRAM  06/04/2018  . INFLUENZA VACCINE  06/27/2019  . COLONOSCOPY  04/18/2022  . TETANUS/TDAP  05/21/2027  . Hepatitis C Screening   Completed    The following portions of the patient's history were reviewed and updated as appropriate: allergies, current medications, past family history, past medical history, past social history, past surgical history and problem list.  Review of Systems Pertinent items noted in HPI and remainder of comprehensive ROS otherwise negative.   Objective:    BP 108/78   Pulse (!) 54   Temp (!) 97 F (36.1 C) (Temporal)   Wt 172 lb (78 kg)   SpO2 99%   BMI 31.46 kg/m  General appearance: alert, cooperative and no distress Head: Normocephalic, without obvious abnormality, atraumatic Eyes: conjunctivae/corneas clear. PERRL, EOM's intact. Fundi benign. Ears: normal TM's and external ear canals both ears Nose: Nares normal. Septum midline. Mucosa normal. No drainage or sinus tenderness. Throat: lips, mucosa, and tongue normal; teeth and gums normal Neck: no adenopathy, no carotid bruit, no JVD, supple, symmetrical, trachea midline and thyroid not enlarged, symmetric, no tenderness/mass/nodules Lungs: clear to auscultation bilaterally Heart: regular rate and rhythm, S1, S2 normal, no murmur, click, rub or gallop Abdomen: soft, non-tender; bowel sounds normal; no masses,  no organomegaly Extremities: extremities normal,  atraumatic, no cyanosis or edema Pulses: 2+ and symmetric Skin: Skin color, texture, turgor normal. No rashes or lesions Lymph nodes: Cervical, supraclavicular, and axillary nodes normal. Neurologic: Alert and oriented X 3, normal strength and tone. Normal symmetric reflexes. Normal coordination and gait    Assessment:    Healthy female exam.     Plan:     Anticipatory guidance given including wearing seatbelts, smoke detectors in the home, increasing physical activity, increasing p.o. intake of water and vegetables. -will obtain labs -followed by OB/Gyn for pap and mammogram -offered influenza vaccine.  Pt declines. -will complete PA if needed. -next CPE in 1  yr See After Visit Summary for Counseling Recommendations    Essential hypertension  - Plan: CBC with Differential/Platelet, Comprehensive metabolic panel  Mixed hyperlipidemia  - Plan: Lipid panel  Nontoxic multinodular goiter  - Plan: TSH  Personal history of renal cancer  -s/p L nephrectomy - Plan: DG Chest 2 View, Comprehensive metabolic panel  Screening for diabetes mellitus  - Plan: Hemoglobin A1c  F/u prn  Grier Mitts, MD

## 2019-07-29 NOTE — Patient Instructions (Signed)

## 2019-07-30 ENCOUNTER — Other Ambulatory Visit: Payer: Self-pay

## 2019-07-30 ENCOUNTER — Ambulatory Visit (INDEPENDENT_AMBULATORY_CARE_PROVIDER_SITE_OTHER)
Admission: RE | Admit: 2019-07-30 | Discharge: 2019-07-30 | Disposition: A | Discharge status: Home / Self Care | Payer: BC Managed Care – PPO | Source: Ambulatory Visit | Attending: Family Medicine | Admitting: Family Medicine

## 2019-07-30 DIAGNOSIS — Z85528 Personal history of other malignant neoplasm of kidney: Secondary | ICD-10-CM

## 2019-09-02 DIAGNOSIS — I10 Essential (primary) hypertension: Secondary | ICD-10-CM | POA: Diagnosis not present

## 2019-09-02 DIAGNOSIS — E782 Mixed hyperlipidemia: Secondary | ICD-10-CM | POA: Diagnosis not present

## 2019-09-02 DIAGNOSIS — E039 Hypothyroidism, unspecified: Secondary | ICD-10-CM | POA: Diagnosis not present

## 2019-09-02 DIAGNOSIS — J302 Other seasonal allergic rhinitis: Secondary | ICD-10-CM | POA: Diagnosis not present

## 2019-09-04 ENCOUNTER — Telehealth: Payer: Self-pay | Admitting: Family Medicine

## 2019-09-04 NOTE — Telephone Encounter (Signed)
Denise with [employer] calling to state that the linaclotide (LINZESS) 290 MCG CAPS capsule requires prior authorization for medical necessity.  Please submit with high priority. Prior Josem Kaufmann can be submitted through CoverMyMeds.

## 2019-09-12 ENCOUNTER — Encounter: Payer: Self-pay | Admitting: Family Medicine

## 2019-09-14 NOTE — Telephone Encounter (Signed)
Rx was sent to prior Auth, waiting for response

## 2019-09-25 DIAGNOSIS — Z713 Dietary counseling and surveillance: Secondary | ICD-10-CM | POA: Diagnosis not present

## 2019-10-12 DIAGNOSIS — H401133 Primary open-angle glaucoma, bilateral, severe stage: Secondary | ICD-10-CM | POA: Diagnosis not present

## 2019-10-14 DIAGNOSIS — Z713 Dietary counseling and surveillance: Secondary | ICD-10-CM | POA: Diagnosis not present

## 2019-10-30 DIAGNOSIS — J302 Other seasonal allergic rhinitis: Secondary | ICD-10-CM | POA: Diagnosis not present

## 2019-10-30 DIAGNOSIS — Z760 Encounter for issue of repeat prescription: Secondary | ICD-10-CM | POA: Diagnosis not present

## 2019-10-30 DIAGNOSIS — I10 Essential (primary) hypertension: Secondary | ICD-10-CM | POA: Diagnosis not present

## 2019-10-30 DIAGNOSIS — E782 Mixed hyperlipidemia: Secondary | ICD-10-CM | POA: Diagnosis not present

## 2019-11-02 DIAGNOSIS — Z6832 Body mass index (BMI) 32.0-32.9, adult: Secondary | ICD-10-CM | POA: Diagnosis not present

## 2019-11-02 DIAGNOSIS — H401133 Primary open-angle glaucoma, bilateral, severe stage: Secondary | ICD-10-CM | POA: Diagnosis not present

## 2019-11-02 DIAGNOSIS — Z1231 Encounter for screening mammogram for malignant neoplasm of breast: Secondary | ICD-10-CM | POA: Diagnosis not present

## 2019-11-02 DIAGNOSIS — N951 Menopausal and female climacteric states: Secondary | ICD-10-CM | POA: Diagnosis not present

## 2019-11-02 DIAGNOSIS — Z01419 Encounter for gynecological examination (general) (routine) without abnormal findings: Secondary | ICD-10-CM | POA: Diagnosis not present

## 2019-11-02 NOTE — Telephone Encounter (Signed)
Prior Auth was approved and Rx sent to pharmacy

## 2019-11-13 DIAGNOSIS — Z713 Dietary counseling and surveillance: Secondary | ICD-10-CM | POA: Diagnosis not present

## 2020-02-11 ENCOUNTER — Ambulatory Visit: Payer: Self-pay | Attending: Internal Medicine

## 2020-02-11 DIAGNOSIS — Z23 Encounter for immunization: Secondary | ICD-10-CM

## 2020-02-11 NOTE — Progress Notes (Signed)
   Covid-19 Vaccination Clinic  Name:  [name]    MRN: [id number] DOB: 1956/02/28  02/11/2020  [name] was observed post Covid-19 immunization for 15 minutes without incident. She was provided with Vaccine Information Sheet and instruction to access the V-Safe system.   [name] was instructed to call 911 with any severe reactions post vaccine: [name] Difficulty breathing  . Swelling of face and throat  . A fast heartbeat  . A bad rash all over body  . Dizziness and weakness   Immunizations Administered    Name Date Dose VIS Date Route   Pfizer COVID-19 Vaccine 02/11/2020  8:48 AM 0.3 mL 11/06/2019 Intramuscular   Manufacturer: [place]   Lot: [id number]   [place]: [id number]

## 2020-03-07 ENCOUNTER — Ambulatory Visit: Payer: Self-pay | Attending: Internal Medicine

## 2020-03-07 DIAGNOSIS — Z23 Encounter for immunization: Secondary | ICD-10-CM

## 2020-03-07 DIAGNOSIS — H401133 Primary open-angle glaucoma, bilateral, severe stage: Secondary | ICD-10-CM | POA: Diagnosis not present

## 2020-03-07 NOTE — Progress Notes (Signed)
   Covid-19 Vaccination Clinic  Name:  Savannah Hubbard    MRN: [id number] DOB: 31-May-1956  03/07/2020  [name] was observed post Covid-19 immunization for 30 minutes based on pre-vaccination screening without incident. She was provided with Vaccine Information Sheet and instruction to access the V-Safe system.   [name] was instructed to call 911 with any severe reactions post vaccine: [name] Difficulty breathing  . Swelling of face and throat  . A fast heartbeat  . A bad rash all over body  . Dizziness and weakness   Immunizations Administered    Name Date Dose VIS Date Route   Pfizer COVID-19 Vaccine 03/07/2020  8:24 AM 0.3 mL 11/06/2019 Intramuscular   Manufacturer: [place]   Lot: [id number]   [place]: [id number]

## 2020-03-21 DIAGNOSIS — E039 Hypothyroidism, unspecified: Secondary | ICD-10-CM | POA: Insufficient documentation

## 2020-03-21 DIAGNOSIS — E669 Obesity, unspecified: Secondary | ICD-10-CM | POA: Insufficient documentation

## 2020-03-21 DIAGNOSIS — J302 Other seasonal allergic rhinitis: Secondary | ICD-10-CM | POA: Insufficient documentation

## 2020-03-21 DIAGNOSIS — Z760 Encounter for issue of repeat prescription: Secondary | ICD-10-CM | POA: Insufficient documentation

## 2020-03-22 DIAGNOSIS — N952 Postmenopausal atrophic vaginitis: Secondary | ICD-10-CM | POA: Insufficient documentation

## 2020-03-22 DIAGNOSIS — N941 Unspecified dyspareunia: Secondary | ICD-10-CM | POA: Insufficient documentation

## 2020-03-22 DIAGNOSIS — N898 Other specified noninflammatory disorders of vagina: Secondary | ICD-10-CM | POA: Insufficient documentation

## 2020-06-11 DIAGNOSIS — H401133 Primary open-angle glaucoma, bilateral, severe stage: Secondary | ICD-10-CM | POA: Diagnosis not present

## 2020-08-03 ENCOUNTER — Other Ambulatory Visit: Payer: Self-pay

## 2020-08-04 ENCOUNTER — Ambulatory Visit (INDEPENDENT_AMBULATORY_CARE_PROVIDER_SITE_OTHER): Payer: BC Managed Care – PPO | Admitting: Family Medicine

## 2020-08-04 ENCOUNTER — Encounter: Payer: Self-pay | Admitting: Family Medicine

## 2020-08-04 VITALS — BP 110/76 | HR 68 | Temp 98.5°F | Wt 180.0 lb

## 2020-08-04 DIAGNOSIS — H401133 Primary open-angle glaucoma, bilateral, severe stage: Secondary | ICD-10-CM

## 2020-08-04 DIAGNOSIS — E039 Hypothyroidism, unspecified: Secondary | ICD-10-CM | POA: Diagnosis not present

## 2020-08-04 DIAGNOSIS — I10 Essential (primary) hypertension: Secondary | ICD-10-CM | POA: Diagnosis not present

## 2020-08-04 DIAGNOSIS — E782 Mixed hyperlipidemia: Secondary | ICD-10-CM | POA: Diagnosis not present

## 2020-08-04 DIAGNOSIS — R635 Abnormal weight gain: Secondary | ICD-10-CM | POA: Diagnosis not present

## 2020-08-04 DIAGNOSIS — Z0000 Encounter for general adult medical examination without abnormal findings: Secondary | ICD-10-CM | POA: Diagnosis not present

## 2020-08-04 DIAGNOSIS — E042 Nontoxic multinodular goiter: Secondary | ICD-10-CM | POA: Diagnosis not present

## 2020-08-04 MED ORDER — LEVOTHYROXINE SODIUM 88 MCG PO TABS: ORAL_TABLET | ORAL | 3 refills | Status: DC

## 2020-08-04 MED ORDER — LINACLOTIDE 290 MCG PO CAPS
290.0000 ug | ORAL_CAPSULE | Freq: Every day | ORAL | 3 refills | Status: DC
Start: 2020-08-04 — End: 2023-08-22

## 2020-08-04 MED ORDER — FUROSEMIDE 20 MG PO TABS
ORAL_TABLET | ORAL | 3 refills | Status: DC
Start: 2020-08-04 — End: 2021-08-10

## 2020-08-04 MED ORDER — LOSARTAN POTASSIUM 100 MG PO TABS: 100.0000 mg | ORAL_TABLET | Freq: Every day | ORAL | 3 refills | Status: DC

## 2020-08-04 MED ORDER — SIMVASTATIN 20 MG PO TABS: ORAL_TABLET | ORAL | 3 refills | Status: DC

## 2020-08-04 MED ORDER — AMLODIPINE BESYLATE 5 MG PO TABS: 5.0000 mg | ORAL_TABLET | Freq: Every day | ORAL | 3 refills | Status: DC

## 2020-08-04 NOTE — Progress Notes (Signed)
Subjective:     Savannah Hubbard is a 64 y.o. female and is here for a comprehensive physical exam. Pt seen by Ophthalmology for severe primary angle glaucoma of both eyes.  States eyes constantly water.  Vision has remained the same, but not worse.  Pt wants to lose weight.  Inquires about injectable medication.  Not currently exercising.  May eat once or twice per day.  Likes bread.  Does not feel like meal prepping works for her.    Mammogram done 2021 Pap done 2021 Social History   Socioeconomic History  . Marital status: Married    Spouse name: Not on file  . Number of children: 2  . Years of education: Not on file  . Highest education level: Not on file  Occupational History  . Not on file  Tobacco Use  . Smoking status: Never Smoker  . Smokeless tobacco: Never Used  Substance and Sexual Activity  . Alcohol use: No  . Drug use: No  . Sexual activity: Not on file  Other Topics Concern  . Not on file  Social History Narrative  . Not on file   Social Determinants of Health   Financial Resource Strain:   . Difficulty of Paying Living Expenses: Not on file  Food Insecurity:   . Worried About Charity fundraiser in the Last Year: Not on file  . Ran Out of Food in the Last Year: Not on file  Transportation Needs:   . Lack of Transportation (Medical): Not on file  . Lack of Transportation (Non-Medical): Not on file  Physical Activity:   . Days of Exercise per Week: Not on file  . Minutes of Exercise per Session: Not on file  Stress:   . Feeling of Stress : Not on file  Social Connections:   . Frequency of Communication with Friends and Family: Not on file  . Frequency of Social Gatherings with Friends and Family: Not on file  . Attends Religious Services: Not on file  . Active Member of Clubs or Organizations: Not on file  . Attends Archivist Meetings: Not on file  . Marital Status: Not on file  Intimate Partner Violence:   . Fear of Current or Ex-Partner:  Not on file  . Emotionally Abused: Not on file  . Physically Abused: Not on file  . Sexually Abused: Not on file   Health Maintenance  Topic Date Due  . HIV Screening  Never done  . PAP SMEAR-Modifier  04/27/2015  . MAMMOGRAM  06/04/2018  . INFLUENZA VACCINE  Never done  . COLONOSCOPY  04/18/2022  . TETANUS/TDAP  05/21/2027  . COVID-19 Vaccine  Completed  . Hepatitis C Screening  Completed    The following portions of the patient's history were reviewed and updated as appropriate: allergies, current medications, past family history, past medical history, past social history, past surgical history and problem list.  Review of Systems Pertinent items noted in HPI and remainder of comprehensive ROS otherwise negative.   Objective:    BP 110/76 (BP Location: Left Arm, Patient Position: Sitting, Cuff Size: Large)   Pulse 68   Temp 98.5 F (36.9 C) (Oral)   Wt 180 lb (81.6 kg)   SpO2 98%   BMI 32.92 kg/m  General appearance: alert, cooperative and no distress Head: Normocephalic, without obvious abnormality, atraumatic Eyes: conjunctivae/corneas clear. PERRL, EOM's intact. Fundi benign. Ears: normal TM's and external ear canals both ears Nose: Nares normal. Septum midline. Mucosa normal.  No drainage or sinus tenderness. Throat: lips, mucosa, and tongue normal; teeth and gums normal Neck: no adenopathy, no carotid bruit, no JVD, supple, symmetrical, trachea midline and thyroid not enlarged, symmetric, no tenderness/mass/nodules Lungs: clear to auscultation bilaterally Heart: regular rate and rhythm, S1, S2 normal, no murmur, click, rub or gallop Abdomen: soft, non-tender; bowel sounds normal; no masses,  no organomegaly Extremities: extremities normal, atraumatic, no cyanosis or edema Pulses: 2+ and symmetric Skin: Skin color, texture, turgor normal. No rashes or lesions Lymph nodes: Cervical, supraclavicular, and axillary nodes normal. Neurologic: Alert and oriented X 3,  normal strength and tone. Normal symmetric reflexes. Normal coordination and gait    Assessment:    Healthy female exam.      Plan:     Anticipatory guidance given including wearing seatbelts, smoke detectors in the home, increasing physical activity, increasing p.o. intake of water and vegetables. -We will obtain labs -Mammogram done 2021 -Pap smear done 2021 -Colonoscopy done 04/18/2012. Due 2023 -Given handout -Next CPE in 1 year See After Visit Summary for Counseling Recommendations    Essential hypertension  -Controlled -Continue current medications: Lasix 20 mg, losartan 100 mg, Norvasc 5 mg -Continue lifestyle modifications - Plan: CMP with eGFR([facility]), amLODipine (NORVASC) 5 MG tablet, losartan (COZAAR) 100 MG tablet  Weight gain  -Discussed lifestyle modifications - Plan: Hemoglobin A1c, TSH  Mixed hyperlipidemia  -continue zocor 20 mg dialy -continue lifestyle modifications - Plan: Lipid panel, simvastatin (ZOCOR) 20 MG tablet  Nontoxic multinodular goiter  - Plan: TSH, levothyroxine (SYNTHROID) 88 MCG tablet  Primary open angle glaucoma of both eyes, severe -Continue Cosopt 22.3-6.8 mg/ml  And Alphagan 0.1% 1 gtt in both eyes BID -Continue follow-up with ophthalmology  F/u prn in 1 month  Grier Mitts, MD

## 2020-08-04 NOTE — Patient Instructions (Signed)
Preventive Care 40-62 Years Old, Female Preventive care refers to visits with your health care provider and lifestyle choices that can promote health and wellness. This includes:  A yearly physical exam. This may also be called an annual well check.  Regular dental visits and eye exams.  Immunizations.  Screening for certain conditions.  Healthy lifestyle choices, such as eating a healthy diet, getting regular exercise, not using drugs or products that contain nicotine and tobacco, and limiting alcohol use. What can I expect for my preventive care visit? Physical exam Your health care provider will check your:  Height and weight. This may be used to calculate body mass index (BMI), which tells if you are at a healthy weight.  Heart rate and blood pressure.  Skin for abnormal spots. Counseling Your health care provider may ask you questions about your:  Alcohol, tobacco, and drug use.  Emotional well-being.  Home and relationship well-being.  Sexual activity.  Eating habits.  Work and work environment.  Method of birth control.  Menstrual cycle.  Pregnancy history. What immunizations do I need?  Influenza (flu) vaccine  This is recommended every year. Tetanus, diphtheria, and pertussis (Tdap) vaccine  You may need a Td booster every 10 years. Varicella (chickenpox) vaccine  You may need this if you have not been vaccinated. Zoster (shingles) vaccine  You may need this after age 60. Measles, mumps, and rubella (MMR) vaccine  You may need at least one dose of MMR if you were born in 1957 or later. You may also need a second dose. Pneumococcal conjugate (PCV13) vaccine  You may need this if you have certain conditions and were not previously vaccinated. Pneumococcal polysaccharide (PPSV23) vaccine  You may need one or two doses if you smoke cigarettes or if you have certain conditions. Meningococcal conjugate (MenACWY) vaccine  You may need this if you  have certain conditions. Hepatitis A vaccine  You may need this if you have certain conditions or if you travel or work in places where you may be exposed to hepatitis A. Hepatitis B vaccine  You may need this if you have certain conditions or if you travel or work in places where you may be exposed to hepatitis B. Haemophilus influenzae type b (Hib) vaccine  You may need this if you have certain conditions. Human papillomavirus (HPV) vaccine  If recommended by your health care provider, you may need three doses over 6 months. You may receive vaccines as individual doses or as more than one vaccine together in one shot (combination vaccines). Talk with your health care provider about the risks and benefits of combination vaccines. What tests do I need? Blood tests  Lipid and cholesterol levels. These may be checked every 5 years, or more frequently if you are over 50 years old.  Hepatitis C test.  Hepatitis B test. Screening  Lung cancer screening. You may have this screening every year starting at age 55 if you have a 30-pack-year history of smoking and currently smoke or have quit within the past 15 years.  Colorectal cancer screening. All adults should have this screening starting at age 50 and continuing until age 75. Your health care provider may recommend screening at age 45 if you are at increased risk. You will have tests every 1-10 years, depending on your results and the type of screening test.  Diabetes screening. This is done by checking your blood sugar (glucose) after you have not eaten for a while (fasting). You may have this   done every 1-3 years.  Mammogram. This may be done every 1-2 years. Talk with your health care provider about when you should start having regular mammograms. This may depend on whether you have a family history of breast cancer.  BRCA-related cancer screening. This may be done if you have a family history of breast, ovarian, tubal, or peritoneal  cancers.  Pelvic exam and Pap test. This may be done every 3 years starting at age 62. Starting at age 58, this may be done every 5 years if you have a Pap test in combination with an HPV test. Other tests  Sexually transmitted disease (STD) testing.  Bone density scan. This is done to screen for osteoporosis. You may have this scan if you are at high risk for osteoporosis. Follow these instructions at home: Eating and drinking  Eat a diet that includes fresh fruits and vegetables, whole grains, lean protein, and low-fat dairy.  Take vitamin and mineral supplements as recommended by your health care provider.  Do not drink alcohol if: ? Your health care provider tells you not to drink. ? You are pregnant, may be pregnant, or are planning to become pregnant.  If you drink alcohol: ? Limit how much you have to 0-1 drink a day. ? Be aware of how much alcohol is in your drink. In the [place] one drink equals one 12 oz bottle of beer (355 mL), one 5 oz glass of wine (148 mL), or one 1 oz glass of hard liquor (44 mL). Lifestyle  Take daily care of your teeth and gums.  Stay active. Exercise for at least 30 minutes on 5 or more days each week.  Do not use any products that contain nicotine or tobacco, such as cigarettes, e-cigarettes, and chewing tobacco. If you need help quitting, ask your health care provider.  If you are sexually active, practice safe sex. Use a condom or other form of birth control (contraception) in order to prevent pregnancy and STIs (sexually transmitted infections).  If told by your health care provider, take low-dose aspirin daily starting at age 18. What's next?  Visit your health care provider once a year for a well check visit.  Ask your health care provider how often you should have your eyes and teeth checked.  Stay up to date on all vaccines. This information is not intended to replace advice given to you by your health care provider. Make sure you  discuss any questions you have with your health care provider. Document Revised: 07/24/2018 Document Reviewed: 07/24/2018 Elsevier Patient Education  [place].  Preventing Unhealthy [employer], Adult Staying at a healthy weight is important to your overall health. When fat builds up in your body, you may become overweight or obese. Being overweight or obese increases your risk of developing certain health problems, such as heart disease, diabetes, sleeping problems, joint problems, and some types of cancer. Unhealthy weight gain is often the result of making unhealthy food choices or not getting enough exercise. You can make changes to your lifestyle to prevent obesity and stay as healthy as possible. What nutrition changes can be made?   Eat only as much as your body needs. To do this: ? Pay attention to signs that you are hungry or full. Stop eating as soon as you feel full. ? If you feel hungry, try drinking water first before eating. Drink enough water so your urine is clear or pale yellow. ? Eat smaller portions. Pay attention to portion sizes  when eating out. ? Look at serving sizes on food labels. Most foods contain more than one serving per container. ? Eat the recommended number of calories for your gender and activity level. For most active people, a daily total of 2,000 calories is appropriate. If you are trying to lose weight or are not very active, you may need to eat fewer calories. Talk with your health care provider or a diet and nutrition specialist (dietitian) about how many calories you need each day.  Choose healthy foods, such as: ? Fruits and vegetables. At each meal, try to fill at least half of your plate with fruits and vegetables. ? Whole grains, such as whole-wheat bread, brown rice, and quinoa. ? Lean meats, such as chicken or fish. ? Other healthy proteins, such as beans, eggs, or tofu. ? Healthy fats, such as nuts, seeds, fatty fish, and olive  oil. ? Low-fat or fat-free dairy products.  Check food labels, and avoid food and drinks that: ? Are high in calories. ? Have added sugar. ? Are high in sodium. ? Have saturated fats or trans fats.  Cook foods in healthier ways, such as by baking, broiling, or grilling.  Make a meal plan for the week, and shop with a grocery list to help you stay on track with your purchases. Try to avoid going to the grocery store when you are hungry.  When grocery shopping, try to shop around the outside of the store first, where the fresh foods are. Doing this helps you to avoid prepackaged foods, which can be high in sugar, salt (sodium), and fat. What lifestyle changes can be made?   Exercise for 30 or more minutes on 5 or more days each week. Exercising may include brisk walking, yard work, biking, running, swimming, and team sports like basketball and soccer. Ask your health care provider which exercises are safe for you.  Do muscle-strengthening activities, such as lifting weights or using resistance bands, on 2 or more days a week.  Do not use any products that contain nicotine or tobacco, such as cigarettes and e-cigarettes. If you need help quitting, ask your health care provider.  Limit alcohol intake to no more than 1 drink a day for nonpregnant women and 2 drinks a day for men. One drink equals 12 oz of beer, 5 oz of wine, or 1 oz of hard liquor.  Try to get 7-9 hours of sleep each night. What other changes can be made?  Keep a food and activity journal to keep track of: ? What you ate and how many calories you had. Remember to count the calories in sauces, dressings, and side dishes. ? Whether you were active, and what exercises you did. ? Your calorie, weight, and activity goals.  Check your weight regularly. Track any changes. If you notice you have gained weight, make changes to your diet or activity routine.  Avoid taking weight-loss medicines or supplements. Talk to your health  care provider before starting any new medicine or supplement.  Talk to your health care provider before trying any new diet or exercise plan. Why are these changes important? Eating healthy, staying active, and having healthy habits can help you to prevent obesity. Those changes also:  Help you manage stress and emotions.  Help you connect with friends and family.  Improve your self-esteem.  Improve your sleep.  Prevent long-term health problems. What can happen if changes are not made? Being obese or overweight can cause you to develop joint  or bone problems, which can make it hard for you to stay active or do activities you enjoy. Being obese or overweight also puts stress on your heart and lungs and can lead to health problems like diabetes, heart disease, and some cancers. Where to find more information Talk with your health care provider or a dietitian about healthy eating and healthy lifestyle choices. You may also find information from:  [place]. Department of Agriculture, MyPlate: FormerBoss.no  American Heart Association: www.heart.org  Centers for Disease Control and Prevention: http://www.wolf.info/ Summary  Staying at a healthy weight is important to your overall health. It helps you to prevent certain diseases and health problems, such as heart disease, diabetes, joint problems, sleep disorders, and some types of cancer.  Being obese or overweight can cause you to develop joint or bone problems, which can make it hard for you to stay active or do activities you enjoy.  You can prevent unhealthy weight gain by eating a healthy diet, exercising regularly, not smoking, limiting alcohol, and getting enough sleep.  Talk with your health care provider or a dietitian for guidance about healthy eating and healthy lifestyle choices. This information is not intended to replace advice given to you by your health care provider. Make sure you discuss any questions you have with your  health care provider. Document Revised: 11/15/2017 Document Reviewed: 12/19/2016 Elsevier Patient Education  2020 [employer].  Exercising to Lose Weight Exercise is structured, repetitive physical activity to improve fitness and health. Getting regular exercise is important for everyone. It is especially important if you are overweight. Being overweight increases your risk of heart disease, stroke, diabetes, high blood pressure, and several types of cancer. Reducing your calorie intake and exercising can help you lose weight. Exercise is usually categorized as moderate or vigorous intensity. To lose weight, most people need to do a certain amount of moderate-intensity or vigorous-intensity exercise each week. Moderate-intensity exercise  Moderate-intensity exercise is any activity that gets you moving enough to burn at least three times more energy (calories) than if you were sitting. Examples of moderate exercise include:  Walking a mile in 15 minutes.  Doing light yard work.  Biking at an easy pace. Most people should get at least 150 minutes (2 hours and 30 minutes) a week of moderate-intensity exercise to maintain their body weight. Vigorous-intensity exercise Vigorous-intensity exercise is any activity that gets you moving enough to burn at least six times more calories than if you were sitting. When you exercise at this intensity, you should be working hard enough that you are not able to carry on a conversation. Examples of vigorous exercise include:  Running.  Playing a team sport, such as football, basketball, and soccer.  Jumping rope. Most people should get at least 75 minutes (1 hour and 15 minutes) a week of vigorous-intensity exercise to maintain their body weight. How can exercise affect me? When you exercise enough to burn more calories than you eat, you lose weight. Exercise also reduces body fat and builds muscle. The more muscle you have, the more calories you burn.  Exercise also:  Improves mood.  Reduces stress and tension.  Improves your overall fitness, flexibility, and endurance.  Increases bone strength. The amount of exercise you need to lose weight depends on:  Your age.  The type of exercise.  Any health conditions you have.  Your overall physical ability. Talk to your health care provider about how much exercise you need and what types of activities are safe  for you. What actions can I take to lose weight? Nutrition   Make changes to your diet as told by your health care provider or diet and nutrition specialist (dietitian). This may include: ? Eating fewer calories. ? Eating more protein. ? Eating less unhealthy fats. ? Eating a diet that includes fresh fruits and vegetables, whole grains, low-fat dairy products, and lean protein. ? Avoiding foods with added fat, salt, and sugar.  Drink plenty of water while you exercise to prevent dehydration or heat stroke. Activity  Choose an activity that you enjoy and set realistic goals. Your health care provider can help you make an exercise plan that works for you.  Exercise at a moderate or vigorous intensity most days of the week. ? The intensity of exercise may vary from person to person. You can tell how intense a workout is for you by paying attention to your breathing and heartbeat. Most people will notice their breathing and heartbeat get faster with more intense exercise.  Do resistance training twice each week, such as: ? Push-ups. ? Sit-ups. ? Lifting weights. ? Using resistance bands.  Getting short amounts of exercise can be just as helpful as long structured periods of exercise. If you have trouble finding time to exercise, try to include exercise in your daily routine. ? Get up, stretch, and walk around every 30 minutes throughout the day. ? Go for a walk during your lunch break. ? Park your car farther away from your destination. ? If you take public  transportation, get off one stop early and walk the rest of the way. ? Make phone calls while standing up and walking around. ? Take the stairs instead of elevators or escalators.  Wear comfortable clothes and shoes with good support.  Do not exercise so much that you hurt yourself, feel dizzy, or get very short of breath. Where to find more information  [place]. Department of Health and Human Services: [employer].at  Centers for Disease Control and Prevention (CDC): http://www.wolf.info/ Contact a health care provider:  Before starting a new exercise program.  If you have questions or concerns about your weight.  If you have a medical problem that keeps you from exercising. Get help right away if you have any of the following while exercising:  Injury.  Dizziness.  Difficulty breathing or shortness of breath that does not go away when you stop exercising.  Chest pain.  Rapid heartbeat. Summary  Being overweight increases your risk of heart disease, stroke, diabetes, high blood pressure, and several types of cancer.  Losing weight happens when you burn more calories than you eat.  Reducing the amount of calories you eat in addition to getting regular moderate or vigorous exercise each week helps you lose weight. This information is not intended to replace advice given to you by your health care provider. Make sure you discuss any questions you have with your health care provider. Document Revised: 11/25/2017 Document Reviewed: 11/25/2017 Elsevier Patient Education  2020 [employer].

## 2020-08-05 LAB — COMPLETE METABOLIC PANEL WITH GFR
AG Ratio: 1.2 (calc) (ref 1.0–2.5)
ALT: 6 U/L (ref 6–29)
AST: 17 U/L (ref 10–35)
Albumin: 3.9 g/dL (ref 3.6–5.1)
Alkaline phosphatase (APISO): 68 U/L (ref 37–153)
BUN/Creatinine Ratio: 16 (calc) (ref 6–22)
BUN: 16 mg/dL (ref 7–25)
CO2: 25 mmol/L (ref 20–32)
Calcium: 9.4 mg/dL (ref 8.6–10.4)
Chloride: 107 mmol/L (ref 98–110)
Creat: 1 mg/dL — ABNORMAL HIGH (ref 0.50–0.99)
GFR, Est African American: 70 mL/min/{1.73_m2} (ref >=60)
GFR, Est Non African American: 60 mL/min/{1.73_m2} (ref >=60)
Globulin: 3.2 g/dL (calc) (ref 1.9–3.7)
Glucose, Bld: 94 mg/dL (ref 65–99)
Potassium: 3.9 mmol/L (ref 3.5–5.3)
Sodium: 141 mmol/L (ref 135–146)
Total Bilirubin: 0.5 mg/dL (ref 0.2–1.2)
Total Protein: 7.1 g/dL (ref 6.1–8.1)

## 2020-08-05 LAB — TSH: TSH: 0.89 mIU/L (ref 0.40–4.50)

## 2020-08-05 LAB — CBC WITH DIFFERENTIAL/PLATELET
Absolute Monocytes: 562 cells/uL (ref 200–950)
Basophils Absolute: 77 cells/uL (ref 0–200)
Basophils Relative: 1 %
Eosinophils Absolute: 169 cells/uL (ref 15–500)
Eosinophils Relative: 2.2 %
HCT: 44.2 % (ref 35.0–45.0)
Hemoglobin: 14.9 g/dL (ref 11.7–15.5)
Lymphs Abs: 1948 cells/uL (ref 850–3900)
MCH: 32 pg (ref 27.0–33.0)
MCHC: 33.7 g/dL (ref 32.0–36.0)
MCV: 94.8 fL (ref 80.0–100.0)
MPV: 10.1 fL (ref 7.5–12.5)
Monocytes Relative: 7.3 %
Neutro Abs: 4943 cells/uL (ref 1500–7800)
Neutrophils Relative %: 64.2 %
Platelets: 266 10*3/uL (ref 140–400)
RBC: 4.66 10*6/uL (ref 3.80–5.10)
RDW: 13.1 % (ref 11.0–15.0)
Total Lymphocyte: 25.3 %
WBC: 7.7 10*3/uL (ref 3.8–10.8)

## 2020-08-05 LAB — LIPID PANEL
Cholesterol: 149 mg/dL (ref <200)
HDL: 57 mg/dL (ref >=50)
LDL Cholesterol (Calc): 72 mg/dL (calc)
Non-HDL Cholesterol (Calc): 92 mg/dL (calc) (ref <130)
Total CHOL/HDL Ratio: 2.6 (calc) (ref <5.0)
Triglycerides: 115 mg/dL (ref <150)

## 2020-08-05 LAB — HEMOGLOBIN A1C
Hgb A1c MFr Bld: 5.8 % of total Hgb — ABNORMAL HIGH (ref <5.7)
Mean Plasma Glucose: 120 (calc)
eAG (mmol/L): 6.6 (calc)

## 2020-08-12 DIAGNOSIS — H401133 Primary open-angle glaucoma, bilateral, severe stage: Secondary | ICD-10-CM | POA: Insufficient documentation

## 2020-12-29 DIAGNOSIS — R7303 Prediabetes: Secondary | ICD-10-CM | POA: Insufficient documentation

## 2021-07-19 ENCOUNTER — Telehealth: Payer: Self-pay

## 2021-07-19 NOTE — Telephone Encounter (Signed)
Patient would like to know if an order can be in placed for a chest x-ray prior to her appt on 9/15 patient stated she has one every year

## 2021-07-20 NOTE — Telephone Encounter (Signed)
Called pt, no answer, left vm to call office.

## 2021-07-21 NOTE — Telephone Encounter (Signed)
Called patient, no answer. Left vm to call office. Provider will discuss Xray at appointment.

## 2021-08-09 ENCOUNTER — Other Ambulatory Visit: Payer: Self-pay

## 2021-08-10 ENCOUNTER — Ambulatory Visit (INDEPENDENT_AMBULATORY_CARE_PROVIDER_SITE_OTHER): Payer: Managed Care, Other (non HMO) | Admitting: Family Medicine

## 2021-08-10 ENCOUNTER — Encounter: Payer: Self-pay | Admitting: Family Medicine

## 2021-08-10 VITALS — BP 142/82 | HR 69 | Ht 61.5 in | Wt 174.0 lb

## 2021-08-10 DIAGNOSIS — Z85528 Personal history of other malignant neoplasm of kidney: Secondary | ICD-10-CM

## 2021-08-10 DIAGNOSIS — E042 Nontoxic multinodular goiter: Secondary | ICD-10-CM | POA: Diagnosis not present

## 2021-08-10 DIAGNOSIS — R7303 Prediabetes: Secondary | ICD-10-CM

## 2021-08-10 DIAGNOSIS — Z23 Encounter for immunization: Secondary | ICD-10-CM | POA: Diagnosis not present

## 2021-08-10 DIAGNOSIS — I10 Essential (primary) hypertension: Secondary | ICD-10-CM

## 2021-08-10 DIAGNOSIS — E782 Mixed hyperlipidemia: Secondary | ICD-10-CM | POA: Diagnosis not present

## 2021-08-10 DIAGNOSIS — K581 Irritable bowel syndrome with constipation: Secondary | ICD-10-CM | POA: Diagnosis not present

## 2021-08-10 DIAGNOSIS — Z0000 Encounter for general adult medical examination without abnormal findings: Secondary | ICD-10-CM

## 2021-08-10 DIAGNOSIS — H401133 Primary open-angle glaucoma, bilateral, severe stage: Secondary | ICD-10-CM

## 2021-08-10 LAB — CBC WITH DIFFERENTIAL/PLATELET
Basophils Absolute: 0.1 10*3/uL (ref 0.0–0.1)
Basophils Relative: 1.3 % (ref 0.0–3.0)
Eosinophils Absolute: 0.2 10*3/uL (ref 0.0–0.7)
Eosinophils Relative: 2.6 % (ref 0.0–5.0)
HCT: 42 % (ref 36.0–46.0)
Hemoglobin: 13.8 g/dL (ref 12.0–15.0)
Lymphocytes Relative: 25.6 % (ref 12.0–46.0)
Lymphs Abs: 1.9 10*3/uL (ref 0.7–4.0)
MCHC: 32.8 g/dL (ref 30.0–36.0)
MCV: 95.1 fl (ref 78.0–100.0)
Monocytes Absolute: 0.4 10*3/uL (ref 0.1–1.0)
Monocytes Relative: 5.2 % (ref 3.0–12.0)
Neutro Abs: 4.7 10*3/uL (ref 1.4–7.7)
Neutrophils Relative %: 65.3 % (ref 43.0–77.0)
Platelets: 235 10*3/uL (ref 150.0–400.0)
RBC: 4.42 Mil/uL (ref 3.87–5.11)
RDW: 14 % (ref 11.5–15.5)
WBC: 7.2 10*3/uL (ref 4.0–10.5)

## 2021-08-10 LAB — TSH: TSH: 0.54 u[IU]/mL (ref 0.35–5.50)

## 2021-08-10 LAB — COMPREHENSIVE METABOLIC PANEL
ALT: 15 U/L (ref 0–35)
AST: 17 U/L (ref 0–37)
Albumin: 3.8 g/dL (ref 3.5–5.2)
Alkaline Phosphatase: 80 U/L (ref 39–117)
BUN: 11 mg/dL (ref 6–23)
CO2: 24 mEq/L (ref 19–32)
Calcium: 9.2 mg/dL (ref 8.4–10.5)
Chloride: 107 mEq/L (ref 96–112)
Creatinine, Ser: 1.01 mg/dL (ref 0.40–1.20)
GFR: 59.36 mL/min — ABNORMAL LOW (ref >60.00)
Glucose, Bld: 89 mg/dL (ref 70–99)
Potassium: 4.2 mEq/L (ref 3.5–5.1)
Sodium: 138 mEq/L (ref 135–145)
Total Bilirubin: 0.5 mg/dL (ref 0.2–1.2)
Total Protein: 6.9 g/dL (ref 6.0–8.3)

## 2021-08-10 LAB — URINALYSIS, ROUTINE W REFLEX MICROSCOPIC
Bilirubin Urine: NEGATIVE
Hgb urine dipstick: NEGATIVE
Ketones, ur: NEGATIVE
Leukocytes,Ua: NEGATIVE
Nitrite: NEGATIVE
RBC / HPF: NONE SEEN (ref 0–?)
Specific Gravity, Urine: 1.02 (ref 1.000–1.030)
Total Protein, Urine: NEGATIVE
Urine Glucose: NEGATIVE
Urobilinogen, UA: 0.2 (ref 0.0–1.0)
pH: 6.5 (ref 5.0–8.0)

## 2021-08-10 LAB — LIPID PANEL
Cholesterol: 147 mg/dL (ref 0–200)
HDL: 54.8 mg/dL (ref >39.00)
LDL Cholesterol: 67 mg/dL (ref 0–99)
NonHDL: 91.71
Total CHOL/HDL Ratio: 3
Triglycerides: 125 mg/dL (ref 0.0–149.0)
VLDL: 25 mg/dL (ref 0.0–40.0)

## 2021-08-10 LAB — T4, FREE: Free T4: 0.84 ng/dL (ref 0.60–1.60)

## 2021-08-10 LAB — HEMOGLOBIN A1C: Hgb A1c MFr Bld: 6.1 % (ref 4.6–6.5)

## 2021-08-10 MED ORDER — LEVOTHYROXINE SODIUM 88 MCG PO TABS: ORAL_TABLET | ORAL | 3 refills | Status: DC

## 2021-08-10 MED ORDER — FUROSEMIDE 20 MG PO TABS: ORAL_TABLET | ORAL | 3 refills | Status: DC

## 2021-08-10 MED ORDER — AMLODIPINE BESYLATE 5 MG PO TABS: 5.0000 mg | ORAL_TABLET | Freq: Every day | ORAL | 3 refills | Status: DC

## 2021-08-10 MED ORDER — LOSARTAN POTASSIUM 100 MG PO TABS: 100.0000 mg | ORAL_TABLET | Freq: Every day | ORAL | 3 refills | Status: DC

## 2021-08-10 NOTE — Progress Notes (Signed)
Subjective:     Savannah Hubbard is a 65 y.o. female and is here for a comprehensive physical exam. The patient reports glaucoma.  Followed by Ophthalmology.  Using eye gtts.  Pt has not really been checking bp, typically sys mid 120s.  Drinking some water but doesn't like the taste.  Pt inquires about order for CXR 2/2 h/o renal carcinoma s/p renal resection.  Advised to have CXR yearly to monitor for possible pulmonary mets.  Had mammogram 04/2021.  Requesting refills on norvasc, lasix, synthroid, linzess, losartan, and simvastatin.  Social History   Socioeconomic History   Marital status: Married    Spouse name: Not on file   Number of children: 2   Years of education: Not on file   Highest education level: Not on file  Occupational History   Not on file  Tobacco Use   Smoking status: Never   Smokeless tobacco: Never  Substance and Sexual Activity   Alcohol use: No   Drug use: No   Sexual activity: Not on file  Other Topics Concern   Not on file  Social History Narrative   Not on file   Social Determinants of Health   Financial Resource Strain: Not on file  Food Insecurity: Not on file  Transportation Needs: Not on file  Physical Activity: Not on file  Stress: Not on file  Social Connections: Not on file  Intimate Partner Violence: Not on file   Health Maintenance  Topic Date Due   HIV Screening  Never done   Zoster Vaccines- Shingrix (1 of 2) Never done   PAP SMEAR-Modifier  04/27/2015   MAMMOGRAM  06/04/2018   Pneumococcal Vaccine 40-44 Years old (2 - PPSV23 or PCV20) 05/21/2019   COVID-19 Vaccine (4 - Booster for Pfizer series) 01/18/2021   INFLUENZA VACCINE  Never done   COLONOSCOPY (Pts 45-85yr Insurance coverage will need to be confirmed)  04/18/2022   TETANUS/TDAP  05/21/2027   Hepatitis C Screening  Completed   HPV VACCINES  Aged Out    The following portions of the patient's history were reviewed and updated as appropriate: allergies, current  medications, past family history, past medical history, past social history, past surgical history, and problem list.  Review of Systems Pertinent items noted in HPI and remainder of comprehensive ROS otherwise negative.   Objective:    BP (!) 142/82 (BP Location: Right Arm, Patient Position: Sitting, Cuff Size: Large)   Pulse 69   Ht 5' 1.5" (1.562 m)   Wt 174 lb (78.9 kg)   SpO2 99%   BMI 32.34 kg/m  General appearance: alert, cooperative, and no distress Head: Normocephalic, without obvious abnormality, atraumatic Eyes: conjunctivae/corneas clear. PERRL, EOM's intact. Fundi benign. Ears: normal TM's and external ear canals both ears Nose: Nares normal. Septum midline. Mucosa normal. No drainage or sinus tenderness. Throat: lips, mucosa, and tongue normal; teeth and gums normal Neck: no adenopathy, no carotid bruit, no JVD, supple, symmetrical, trachea midline, and thyroid not enlarged, symmetric, no tenderness/mass/nodules Lungs: clear to auscultation bilaterally Heart: regular rate and rhythm, S1, S2 normal, no murmur, click, rub or gallop Abdomen: soft, non-tender; bowel sounds normal; no masses,  no organomegaly Extremities: extremities normal, atraumatic, no cyanosis or edema Pulses: 2+ and symmetric Skin: Skin color, texture, turgor normal. No rashes or lesions Lymph nodes: Cervical, supraclavicular, and axillary nodes normal. Neurologic: Alert and oriented X 3, normal strength and tone. Normal symmetric reflexes. Normal coordination and gait    Assessment:  Healthy female exam.      Plan:   Anticipatory guidance given including wearing seatbelts, smoke detectors in the home, increasing physical activity, increasing p.o. intake of water and vegetables. -labs -colonoscopy due 2023.  Done 04/18/2012. -mammogram up to date, done 04/2021 -immunizations advised.   -given handout -next CPE in 1 yr  See After Visit Summary for Counseling Recommendations   Essential  hypertension -elevated -lifestyle modifications  -increase po intake of water -encouraged to check bp at home and keep a log of readings - Plan: CMP, amLODipine (NORVASC) 5 MG tablet, furosemide (LASIX) 20 MG tablet, losartan (COZAAR) 100 MG tablet  Personal history of renal cancer -s/p nephrectomy  - Plan: CBC with Differential/Platelet, DG Chest 2 View, Urinalysis with Reflex Microscopic  Irritable bowel syndrome with constipation -continue linzess -continue f/u with GI  - Plan: CBC with Differential/Platelet, CMP  Mixed hyperlipidemia  -lifestyle modifications -continue simvastatin 20 mg daily - Plan: Lipid panel  Nontoxic multinodular goiter  - Plan: TSH, T4, Free, levothyroxine (SYNTHROID) 88 MCG tablet  Need for influenza vaccination  - Plan: Flu Vaccine QUAD 6+ mos PF IM (Fluarix Quad PF)  Primary open angle glaucoma of both eyes, severe stage -continue f/u with Ophthalmology  - Plan: ROCKLATAN 0.02-0.005 % SOLN  Prediabetes  -hgb A1C 5.8% on 08/04/20 - Plan: Hemoglobin A1c  F/u in the next few months for HTN, in 6 months for stable conditions.  Grier Mitts, MD

## 2021-08-14 ENCOUNTER — Encounter: Payer: Self-pay | Admitting: Family Medicine

## 2021-08-17 ENCOUNTER — Ambulatory Visit (INDEPENDENT_AMBULATORY_CARE_PROVIDER_SITE_OTHER)
Admission: RE | Admit: 2021-08-17 | Discharge: 2021-08-17 | Disposition: A | Discharge status: Home / Self Care | Payer: Managed Care, Other (non HMO) | Source: Ambulatory Visit | Attending: Family Medicine | Admitting: Family Medicine

## 2021-08-17 ENCOUNTER — Other Ambulatory Visit: Payer: Self-pay

## 2021-08-17 DIAGNOSIS — Z85528 Personal history of other malignant neoplasm of kidney: Secondary | ICD-10-CM | POA: Diagnosis not present

## 2021-08-29 ENCOUNTER — Other Ambulatory Visit: Payer: Self-pay | Admitting: Family Medicine

## 2021-08-29 DIAGNOSIS — E782 Mixed hyperlipidemia: Secondary | ICD-10-CM

## 2021-12-06 IMAGING — DX DG CHEST 2V
2 series · 2 of 2 positions shown · non-contrast
Comparison: Chest x-ray 07/30/2019.

CLINICAL DATA: 65-year-old female with history of renal cancer.

EXAM:
CHEST - 2 VIEW

[chest pa]
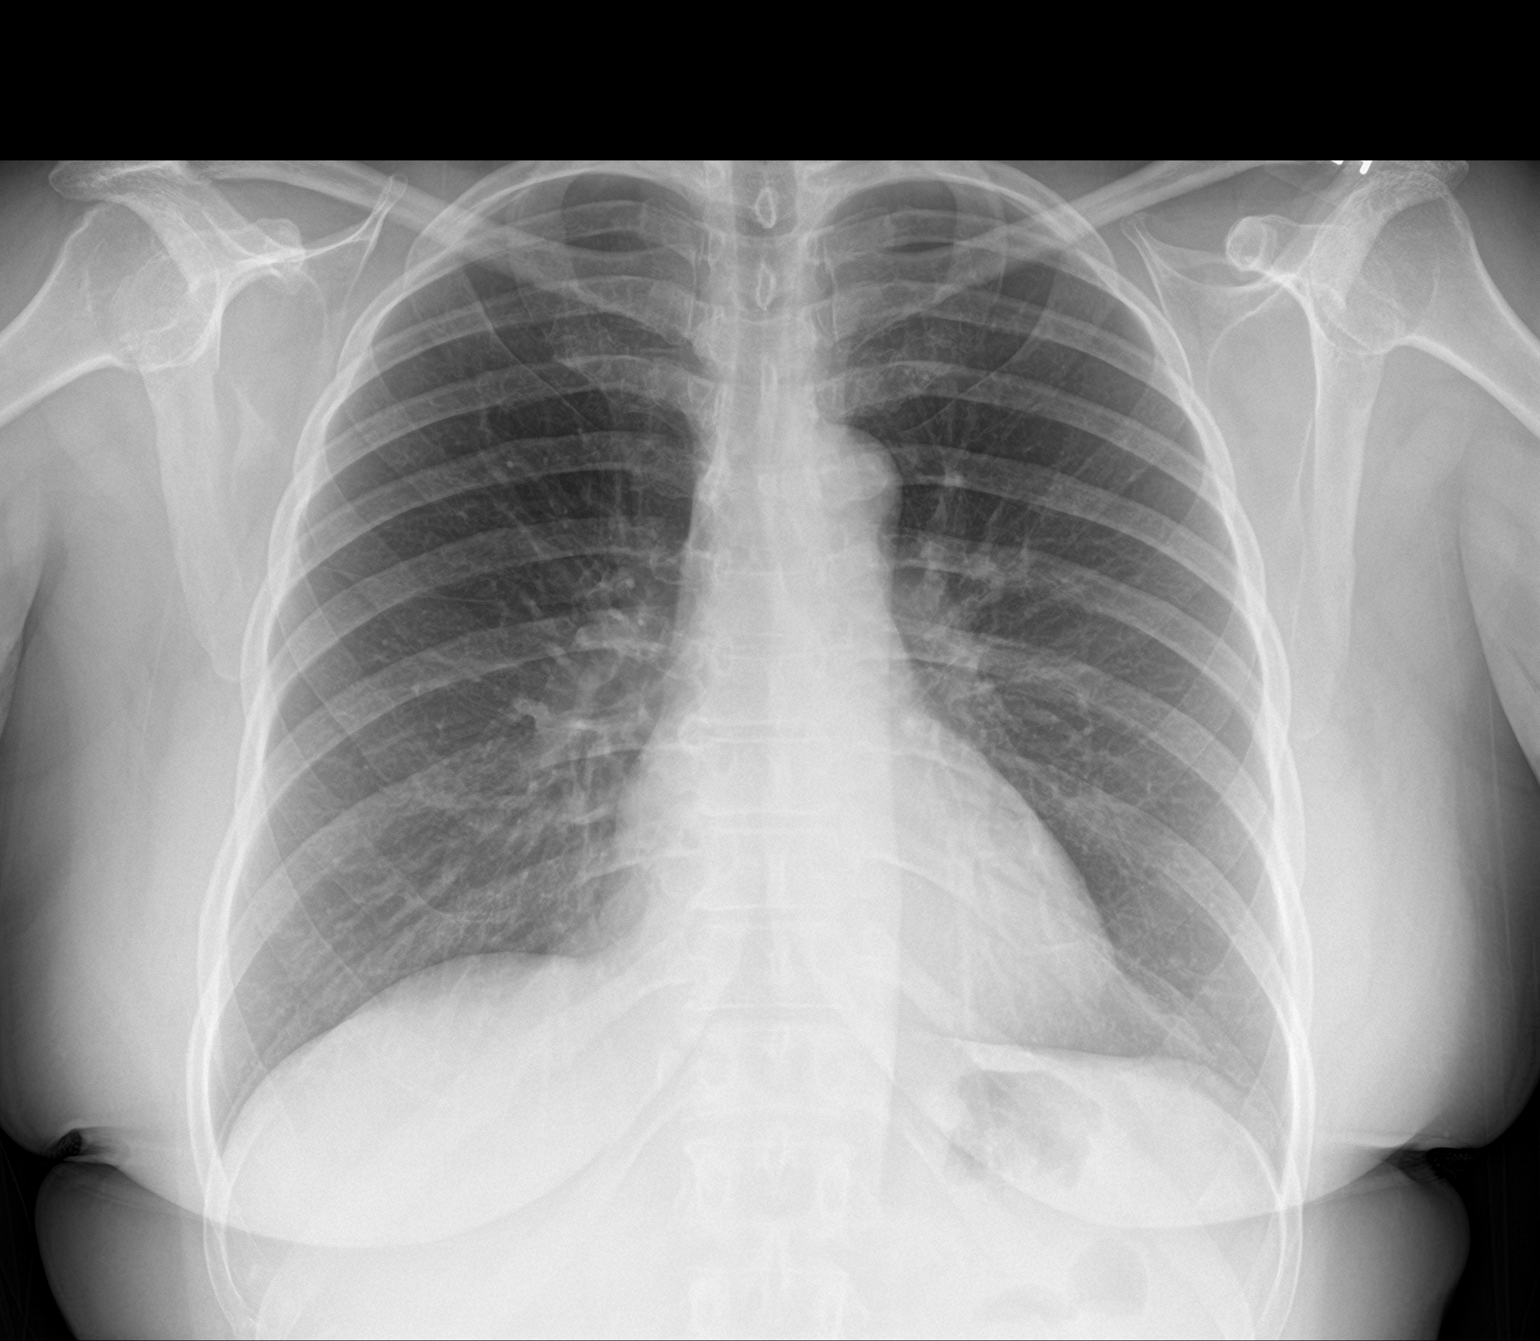

[chest lat]
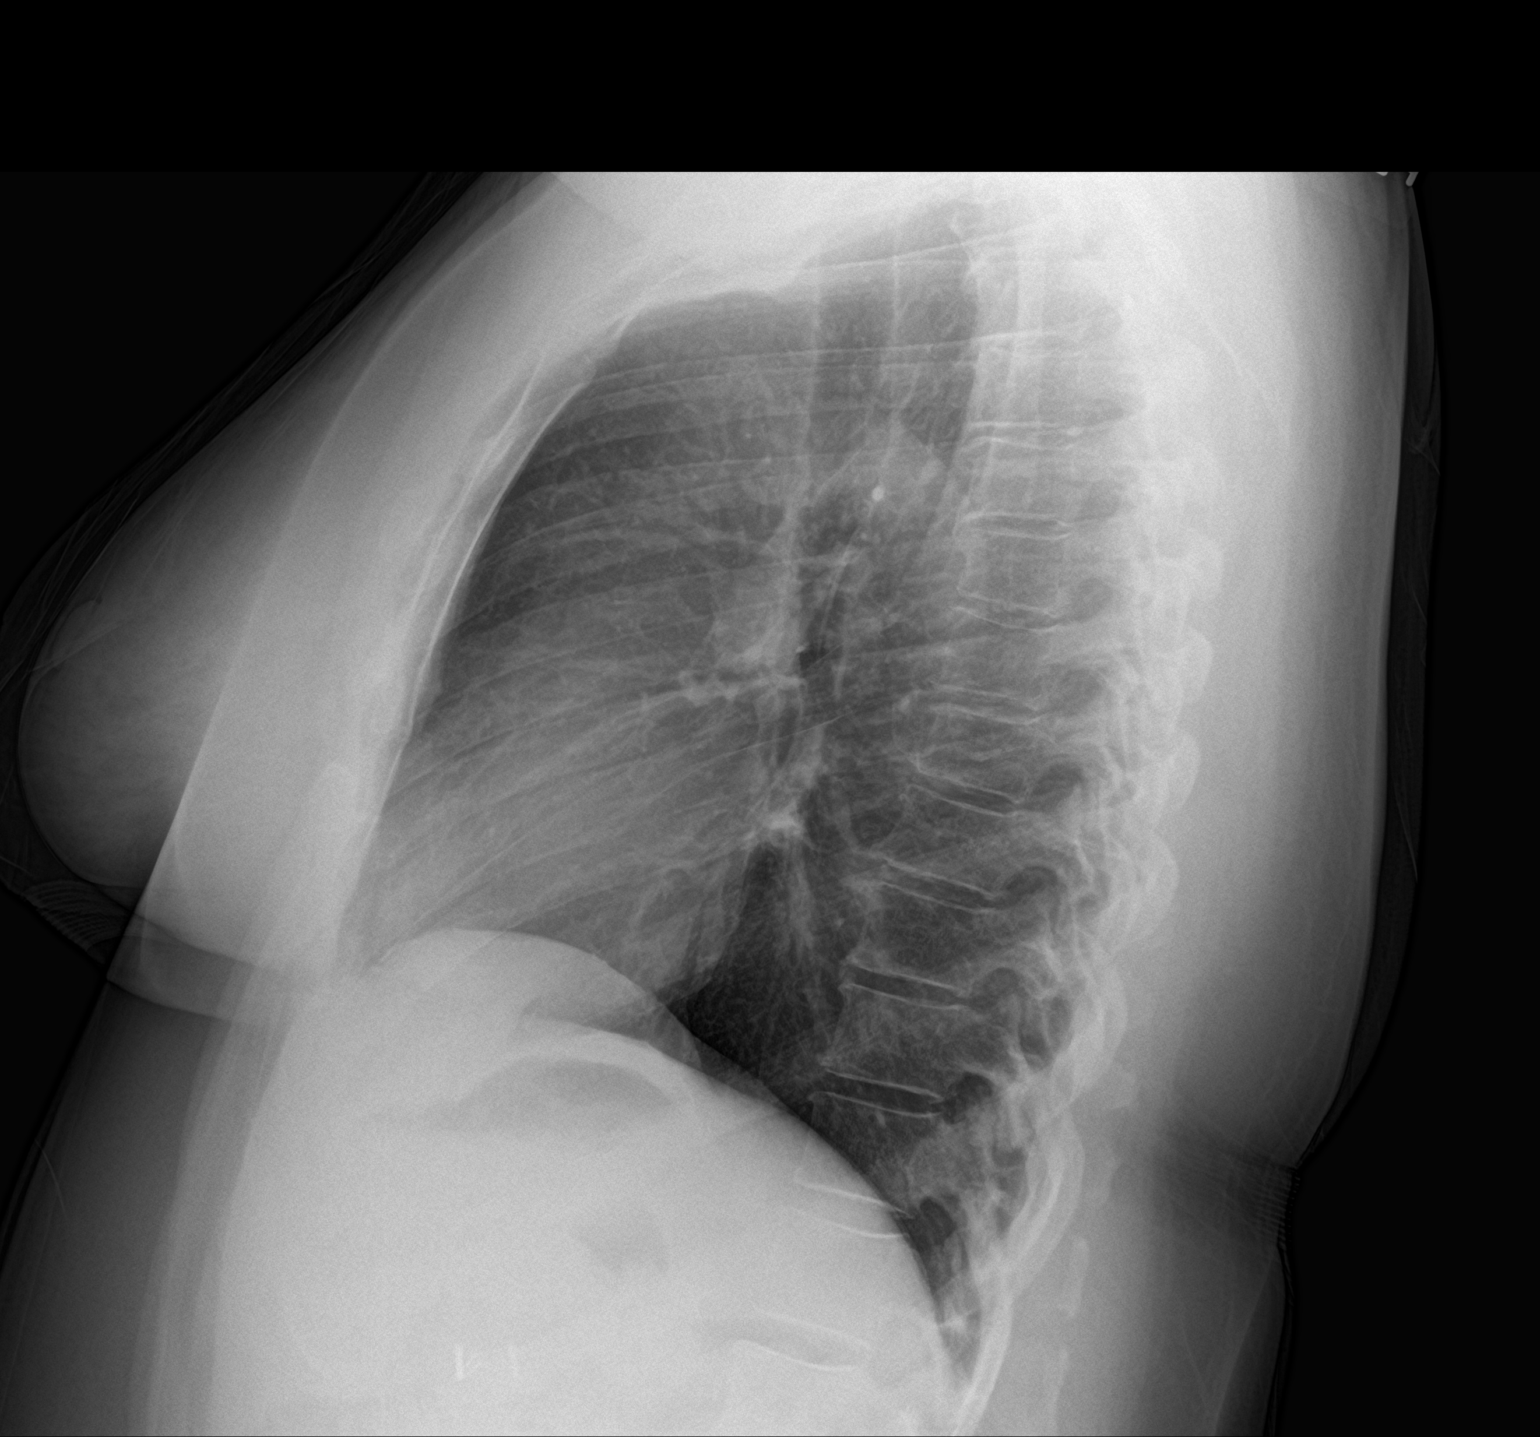

[2 of 2 positions shown; findings below may reference images not displayed]

FINDINGS: Lung volumes are normal. No consolidative airspace disease. No
pleural effusions. No pneumothorax. No pulmonary nodule or mass
noted. Pulmonary vasculature and the cardiomediastinal silhouette
are within normal limits.
IMPRESSION: 1. No evidence of metastatic disease to the lungs. No radiographic
evidence of acute cardiopulmonary disease.

## 2022-02-08 DIAGNOSIS — M25551 Pain in right hip: Secondary | ICD-10-CM | POA: Insufficient documentation

## 2022-02-08 DIAGNOSIS — M25511 Pain in right shoulder: Secondary | ICD-10-CM | POA: Insufficient documentation

## 2022-08-01 ENCOUNTER — Encounter: Payer: Self-pay | Admitting: Family Medicine

## 2022-08-01 LAB — HM COLONOSCOPY

## 2022-08-15 ENCOUNTER — Ambulatory Visit (INDEPENDENT_AMBULATORY_CARE_PROVIDER_SITE_OTHER): Payer: Managed Care, Other (non HMO)

## 2022-08-15 ENCOUNTER — Ambulatory Visit (INDEPENDENT_AMBULATORY_CARE_PROVIDER_SITE_OTHER): Payer: Managed Care, Other (non HMO) | Admitting: Family Medicine

## 2022-08-15 ENCOUNTER — Encounter: Payer: Self-pay | Admitting: Family Medicine

## 2022-08-15 VITALS — BP 136/86 | HR 53 | Temp 98.5°F | Ht 61.25 in | Wt 167.8 lb

## 2022-08-15 DIAGNOSIS — R5383 Other fatigue: Secondary | ICD-10-CM

## 2022-08-15 DIAGNOSIS — I10 Essential (primary) hypertension: Secondary | ICD-10-CM

## 2022-08-15 DIAGNOSIS — H401133 Primary open-angle glaucoma, bilateral, severe stage: Secondary | ICD-10-CM

## 2022-08-15 DIAGNOSIS — Z85528 Personal history of other malignant neoplasm of kidney: Secondary | ICD-10-CM

## 2022-08-15 DIAGNOSIS — Z0000 Encounter for general adult medical examination without abnormal findings: Secondary | ICD-10-CM | POA: Diagnosis not present

## 2022-08-15 DIAGNOSIS — Z23 Encounter for immunization: Secondary | ICD-10-CM

## 2022-08-15 DIAGNOSIS — E782 Mixed hyperlipidemia: Secondary | ICD-10-CM

## 2022-08-15 DIAGNOSIS — E042 Nontoxic multinodular goiter: Secondary | ICD-10-CM | POA: Diagnosis not present

## 2022-08-15 LAB — TSH: TSH: 0.68 u[IU]/mL (ref 0.35–5.50)

## 2022-08-15 LAB — CBC WITH DIFFERENTIAL/PLATELET
Basophils Absolute: 0.1 10*3/uL (ref 0.0–0.1)
Basophils Relative: 1.3 % (ref 0.0–3.0)
Eosinophils Absolute: 0.2 10*3/uL (ref 0.0–0.7)
Eosinophils Relative: 3.2 % (ref 0.0–5.0)
HCT: 43 % (ref 36.0–46.0)
Hemoglobin: 14.3 g/dL (ref 12.0–15.0)
Lymphocytes Relative: 32.6 % (ref 12.0–46.0)
Lymphs Abs: 2 10*3/uL (ref 0.7–4.0)
MCHC: 33.3 g/dL (ref 30.0–36.0)
MCV: 93.8 fl (ref 78.0–100.0)
Monocytes Absolute: 0.5 10*3/uL (ref 0.1–1.0)
Monocytes Relative: 7.5 % (ref 3.0–12.0)
Neutro Abs: 3.4 10*3/uL (ref 1.4–7.7)
Neutrophils Relative %: 55.4 % (ref 43.0–77.0)
Platelets: 244 10*3/uL (ref 150.0–400.0)
RBC: 4.58 Mil/uL (ref 3.87–5.11)
RDW: 13.1 % (ref 11.5–15.5)
WBC: 6.2 10*3/uL (ref 4.0–10.5)

## 2022-08-15 LAB — COMPREHENSIVE METABOLIC PANEL
ALT: 9 U/L (ref 0–35)
AST: 12 U/L (ref 0–37)
Albumin: 3.8 g/dL (ref 3.5–5.2)
Alkaline Phosphatase: 77 U/L (ref 39–117)
BUN: 11 mg/dL (ref 6–23)
CO2: 22 mEq/L (ref 19–32)
Calcium: 9.3 mg/dL (ref 8.4–10.5)
Chloride: 109 mEq/L (ref 96–112)
Creatinine, Ser: 1.06 mg/dL (ref 0.40–1.20)
GFR: 55.62 mL/min — ABNORMAL LOW (ref >60.00)
Glucose, Bld: 86 mg/dL (ref 70–99)
Potassium: 3.7 mEq/L (ref 3.5–5.1)
Sodium: 140 mEq/L (ref 135–145)
Total Bilirubin: 0.4 mg/dL (ref 0.2–1.2)
Total Protein: 7.2 g/dL (ref 6.0–8.3)

## 2022-08-15 LAB — URINALYSIS, ROUTINE W REFLEX MICROSCOPIC
Bilirubin Urine: NEGATIVE
Hgb urine dipstick: NEGATIVE
Ketones, ur: NEGATIVE
Leukocytes,Ua: NEGATIVE
Nitrite: NEGATIVE
Specific Gravity, Urine: 1.025 (ref 1.000–1.030)
Total Protein, Urine: NEGATIVE
Urine Glucose: NEGATIVE
Urobilinogen, UA: 1 (ref 0.0–1.0)
pH: 6 (ref 5.0–8.0)

## 2022-08-15 LAB — VITAMIN B12: Vitamin B-12: 549 pg/mL (ref 211–911)

## 2022-08-15 LAB — LIPID PANEL
Cholesterol: 149 mg/dL (ref 0–200)
HDL: 48.6 mg/dL (ref >39.00)
LDL Cholesterol: 82 mg/dL (ref 0–99)
NonHDL: 99.99
Total CHOL/HDL Ratio: 3
Triglycerides: 92 mg/dL (ref 0.0–149.0)
VLDL: 18.4 mg/dL (ref 0.0–40.0)

## 2022-08-15 LAB — HEMOGLOBIN A1C: Hgb A1c MFr Bld: 6 % (ref 4.6–6.5)

## 2022-08-15 LAB — VITAMIN D 25 HYDROXY (VIT D DEFICIENCY, FRACTURES): VITD: 51.23 ng/mL (ref 30.00–100.00)

## 2022-08-15 LAB — T4, FREE: Free T4: 0.89 ng/dL (ref 0.60–1.60)

## 2022-08-15 MED ORDER — LEVOTHYROXINE SODIUM 88 MCG PO TABS: ORAL_TABLET | ORAL | 3 refills | Status: DC

## 2022-08-15 MED ORDER — LOSARTAN POTASSIUM 100 MG PO TABS: 100.0000 mg | ORAL_TABLET | Freq: Every day | ORAL | 3 refills | Status: DC

## 2022-08-15 MED ORDER — AMLODIPINE BESYLATE 5 MG PO TABS: 5.0000 mg | ORAL_TABLET | Freq: Every day | ORAL | 3 refills | Status: DC

## 2022-08-15 MED ORDER — SIMVASTATIN 20 MG PO TABS: ORAL_TABLET | ORAL | 3 refills | Status: DC

## 2022-08-15 MED ORDER — FUROSEMIDE 20 MG PO TABS: ORAL_TABLET | ORAL | 3 refills | Status: DC

## 2022-08-15 NOTE — Progress Notes (Unsigned)
Subjective:     Savannah Hubbard is a 66 y.o. female and is here for a comprehensive physical exam. The patient reports doing well.  Requesting chest x-ray and UA for history of renal cancer status post nephrectomy.  Patient endorses continued watering of the eyes 2/2 history of primary open-angle glaucoma.  Currently using eyedrops.  Considering surgery.  Patient taking Norvasc 5 mg, Lasix 20 mg, losartan 100 mg daily for BP.  Not currently checking BP.  Denies headaches, CP, changes in vision, LE edema.  Patient notes decreased energy.  Taking Synthroid 88 mcg daily.  Social History   Socioeconomic History   Marital status: Married    Spouse name: Not on file   Number of children: 2   Years of education: Not on file   Highest education level: Not on file  Occupational History   Not on file  Tobacco Use   Smoking status: Never   Smokeless tobacco: Never  Substance and Sexual Activity   Alcohol use: No   Drug use: No   Sexual activity: Not on file  Other Topics Concern   Not on file  Social History Narrative   Not on file   Social Determinants of Health   Financial Resource Strain: Not on file  Food Insecurity: Not on file  Transportation Needs: Not on file  Physical Activity: Not on file  Stress: Not on file  Social Connections: Not on file  Intimate Partner Violence: Not on file   Health Maintenance  Topic Date Due   HIV Screening  Never done   Zoster Vaccines- Shingrix (1 of 2) Never done   PAP SMEAR-Modifier  04/27/2015   MAMMOGRAM  06/04/2018   COVID-19 Vaccine (5 - Pfizer risk series) 10/30/2021   INFLUENZA VACCINE  06/26/2022   TETANUS/TDAP  05/21/2027   COLONOSCOPY (Pts 45-67yr Insurance coverage will need to be confirmed)  08/01/2032   Hepatitis C Screening  Completed   HPV VACCINES  Aged Out    The following portions of the patient's history were reviewed and updated as appropriate: allergies, current medications, past family history, past medical  history, past social history, past surgical history, and problem list.  Review of Systems Pertinent items noted in HPI and remainder of comprehensive ROS otherwise negative.   Objective:    BP 136/86 (BP Location: Left Arm, Patient Position: Sitting, Cuff Size: Normal)   Pulse (!) 53   Temp 98.5 F (36.9 C) (Oral)   Ht 5' 1.25" (1.556 m)   Wt 167 lb 12.8 oz (76.1 kg)   SpO2 99%   BMI 31.45 kg/m  General appearance: alert, cooperative, and no distress Head: Normocephalic, without obvious abnormality, atraumatic Eyes: conjunctivae/corneas clear. PERRL, EOM's intact. Fundi benign. Ears: normal TM's and external ear canals both ears Nose: Nares normal. Septum midline. Mucosa normal. No drainage or sinus tenderness. Throat: lips, mucosa, and tongue normal; teeth and gums normal Neck: no adenopathy, no carotid bruit, no JVD, supple, symmetrical, trachea midline, and thyroid not enlarged, symmetric, no tenderness/mass/nodules Lungs: clear to auscultation bilaterally Heart: regular rate and rhythm, S1, S2 normal, no murmur, click, rub or gallop Abdomen: soft, non-tender; bowel sounds normal; no masses,  no organomegaly Extremities: extremities normal, atraumatic, no cyanosis or edema Pulses: 2+ and symmetric Skin: Skin color, texture, turgor normal. No rashes or lesions Lymph nodes: Cervical, supraclavicular, and axillary nodes normal. Neurologic: Alert and oriented X 3, normal strength and tone. Normal symmetric reflexes. Normal coordination and gait    Assessment:  Healthy female exam.      Plan:    Anticipatory guidance given including wearing seatbelts, smoke detectors in the home, increasing physical activity, increasing p.o. intake of water and vegetables. -Labs -Mammogram to be scheduled -Colonoscopy done 08/01/2022 -Status post hysterectomy Pap not indicated. -Bone density scan due next year -Given handout -Next CPE in 1 year See After Visit Summary for Counseling  Recommendations  Well adult exam - Plan: CBC with Differential/Platelet, TSH, Hemoglobin A1c, Lipid panel  Primary open angle glaucoma (POAG) of both eyes, severe stage -Continue follow-up with ophthalmology -Continue current eyedrops -Given precautions for symptoms.  Essential hypertension -Stable -Continue current medications -Mild modifications -Patient encouraged to monitor BP at home and keep a log to bring with her to clinic. - Plan: CMP, amLODipine (NORVASC) 5 MG tablet, furosemide (LASIX) 20 MG tablet, losartan (COZAAR) 100 MG tablet  Nontoxic multinodular goiter - Plan: TSH, T4, Free, levothyroxine (SYNTHROID) 88 MCG tablet  Decreased energy - Plan: TSH, T4, Free, Vitamin D, 25-hydroxy, Vitamin B12  Mixed hyperlipidemia -Total cholesterol 147, HDL 54.8, LDL 67, triglycerides 125 on 08/10/2021 -Continue lifestyle modifications -Continue Zocor 20 mg daily - Plan: simvastatin (ZOCOR) 20 MG tablet  Personal history of renal cancer -Status post left nephrectomy 2005 - Plan: CBC with Differential/Platelet, DG Chest 2 View, Urinalysis with Reflex Microscopic, Vitamin D, 25-hydroxy  Flu vaccine need - Plan: Flu Vaccine QUAD 6+ mos PF IM (Fluarix Quad PF)  Need for shingles vaccine - Plan: Varicella-zoster vaccine IM   Follow-up in 3-4 months follow-up on HTN  Grier Mitts, MD

## 2022-08-15 NOTE — Patient Instructions (Signed)
We should plan to see each other in the next 4 to 6 months to see how things are going with your blood pressure and other lifestyle changes such as increasing your water intake and exercising.

## 2022-11-08 ENCOUNTER — Other Ambulatory Visit: Payer: Self-pay | Admitting: Family Medicine

## 2022-11-08 DIAGNOSIS — I10 Essential (primary) hypertension: Secondary | ICD-10-CM

## 2022-12-17 ENCOUNTER — Ambulatory Visit (INDEPENDENT_AMBULATORY_CARE_PROVIDER_SITE_OTHER): Payer: Managed Care, Other (non HMO) | Admitting: Family Medicine

## 2022-12-17 VITALS — BP 143/79 | HR 81 | Temp 98.2°F | Ht 61.25 in | Wt 174.0 lb

## 2022-12-17 DIAGNOSIS — R7303 Prediabetes: Secondary | ICD-10-CM

## 2022-12-17 DIAGNOSIS — I10 Essential (primary) hypertension: Secondary | ICD-10-CM

## 2022-12-17 DIAGNOSIS — Z23 Encounter for immunization: Secondary | ICD-10-CM | POA: Diagnosis not present

## 2022-12-17 NOTE — Progress Notes (Signed)
   Established Patient Office Visit   Subjective  Patient ID: Savannah Hubbard, female    DOB: [birth year]  Age: 67 y.o. MRN: [id number]  Chief Complaint  Patient presents with   Medical Management of Chronic Issues    Following up on HTN.     Patient seen for follow-up.  Since last OFV patient had surgery which prevented her from lifting greater than 10 pounds or exercising.  Pt endorses weight gain during this time as she was mostly sitting throughout the day.  Now able to move around more and lift greater than 10 pounds.  Started keeping her granddaughter again who keeps her moving.  Patient drinking smoothies in the morning.  Not checking BP at home.  Taking losartan 100 mg daily and Norvasc 5 mg daily previously on Norvasc 10 mg which caused ankle edema.  Endorses eating 2 pieces of bacon daily and increase weeks over the holidays.      ROS Negative unless stated above    Objective:     BP (!) 143/79 (BP Location: Right Arm, Patient Position: Sitting, Cuff Size: Large)   Pulse 81   Temp 98.2 F (36.8 C) (Oral)   Ht 5' 1.25" (1.556 m)   Wt 174 lb (78.9 kg)   SpO2 98%   BMI 32.61 kg/m    Physical Exam Constitutional:      Appearance: Normal appearance.  HENT:     Head: Normocephalic and atraumatic.     Nose: Nose normal.     Mouth/Throat:     Mouth: Mucous membranes are moist.  Eyes:     Extraocular Movements: Extraocular movements intact.     Conjunctiva/sclera: Conjunctivae normal.     Pupils: Pupils are equal, round, and reactive to light.  Cardiovascular:     Rate and Rhythm: Normal rate and regular rhythm.  Pulmonary:     Effort: Pulmonary effort is normal.     Breath sounds: Normal breath sounds.  Skin:    General: Skin is warm and dry.  Neurological:     Mental Status: She is alert and oriented to person, place, and time. Mental status is at baseline.      No results found for any visits on 12/17/22.    Assessment & Plan:  Essential  hypertension  Prediabetes  Need for shingles vaccine -     Varicella-zoster vaccine IM   BP elevated.  Patient encouraged to monitor at home and bring log to clinic.  Continue losartan 100 mg daily and Norvasc 5 mg daily.  Patient encouraged to increase physical activity and decrease sodium intake.  Will follow-up in 6-8 weeks for BP recheck and A1c.  Shingles vaccine #2 given this visit  Return in about 8 weeks (around 02/11/2023).   Billie Ruddy, MD

## 2023-05-26 ENCOUNTER — Other Ambulatory Visit: Payer: Self-pay | Admitting: Family Medicine

## 2023-05-26 DIAGNOSIS — E782 Mixed hyperlipidemia: Secondary | ICD-10-CM

## 2023-07-06 DIAGNOSIS — H401133 Primary open-angle glaucoma, bilateral, severe stage: Secondary | ICD-10-CM | POA: Diagnosis not present

## 2023-08-14 ENCOUNTER — Encounter: Payer: Self-pay | Admitting: Pharmacist

## 2023-08-18 ENCOUNTER — Encounter: Payer: Self-pay | Admitting: Family Medicine

## 2023-08-21 DIAGNOSIS — K6289 Other specified diseases of anus and rectum: Secondary | ICD-10-CM | POA: Insufficient documentation

## 2023-08-21 DIAGNOSIS — R141 Gas pain: Secondary | ICD-10-CM | POA: Insufficient documentation

## 2023-08-22 ENCOUNTER — Ambulatory Visit: Payer: Managed Care, Other (non HMO)

## 2023-08-22 ENCOUNTER — Encounter: Payer: Self-pay | Admitting: Family Medicine

## 2023-08-22 ENCOUNTER — Ambulatory Visit: Payer: Managed Care, Other (non HMO) | Admitting: Family Medicine

## 2023-08-22 VITALS — BP 122/76 | HR 81 | Temp 98.6°F | Ht 61.25 in | Wt 182.8 lb

## 2023-08-22 DIAGNOSIS — G8929 Other chronic pain: Secondary | ICD-10-CM | POA: Diagnosis not present

## 2023-08-22 DIAGNOSIS — M25562 Pain in left knee: Secondary | ICD-10-CM | POA: Diagnosis not present

## 2023-08-22 DIAGNOSIS — Z85528 Personal history of other malignant neoplasm of kidney: Secondary | ICD-10-CM

## 2023-08-22 DIAGNOSIS — Z905 Acquired absence of kidney: Secondary | ICD-10-CM

## 2023-08-22 DIAGNOSIS — I10 Essential (primary) hypertension: Secondary | ICD-10-CM

## 2023-08-22 DIAGNOSIS — M25462 Effusion, left knee: Secondary | ICD-10-CM | POA: Diagnosis not present

## 2023-08-22 DIAGNOSIS — E782 Mixed hyperlipidemia: Secondary | ICD-10-CM | POA: Diagnosis not present

## 2023-08-22 DIAGNOSIS — Z0000 Encounter for general adult medical examination without abnormal findings: Secondary | ICD-10-CM

## 2023-08-22 DIAGNOSIS — K581 Irritable bowel syndrome with constipation: Secondary | ICD-10-CM

## 2023-08-22 DIAGNOSIS — M722 Plantar fascial fibromatosis: Secondary | ICD-10-CM

## 2023-08-22 DIAGNOSIS — M7632 Iliotibial band syndrome, left leg: Secondary | ICD-10-CM

## 2023-08-22 DIAGNOSIS — R609 Edema, unspecified: Secondary | ICD-10-CM | POA: Diagnosis not present

## 2023-08-22 DIAGNOSIS — Z23 Encounter for immunization: Secondary | ICD-10-CM | POA: Diagnosis not present

## 2023-08-22 DIAGNOSIS — M1712 Unilateral primary osteoarthritis, left knee: Secondary | ICD-10-CM | POA: Diagnosis not present

## 2023-08-22 DIAGNOSIS — R7303 Prediabetes: Secondary | ICD-10-CM

## 2023-08-22 DIAGNOSIS — Z0001 Encounter for general adult medical examination with abnormal findings: Secondary | ICD-10-CM

## 2023-08-22 DIAGNOSIS — E042 Nontoxic multinodular goiter: Secondary | ICD-10-CM

## 2023-08-22 DIAGNOSIS — M7631 Iliotibial band syndrome, right leg: Secondary | ICD-10-CM

## 2023-08-22 LAB — POCT URINALYSIS DIPSTICK
Bilirubin, UA: NEGATIVE
Blood, UA: NEGATIVE
Glucose, UA: NEGATIVE
Ketones, UA: NEGATIVE
Leukocytes, UA: NEGATIVE
Nitrite, UA: NEGATIVE
Protein, UA: NEGATIVE
Spec Grav, UA: 1.02 (ref 1.010–1.025)
Urobilinogen, UA: 0.2 E.U./dL
pH, UA: 6 (ref 5.0–8.0)

## 2023-08-22 LAB — COMPREHENSIVE METABOLIC PANEL
ALT: 15 U/L (ref 0–35)
AST: 16 U/L (ref 0–37)
Albumin: 4.1 g/dL (ref 3.5–5.2)
Alkaline Phosphatase: 81 U/L (ref 39–117)
BUN: 12 mg/dL (ref 6–23)
CO2: 29 mEq/L (ref 19–32)
Calcium: 9.7 mg/dL (ref 8.4–10.5)
Chloride: 103 mEq/L (ref 96–112)
Creatinine, Ser: 0.96 mg/dL (ref 0.40–1.20)
GFR: 62.2 mL/min (ref >60.00)
Glucose, Bld: 92 mg/dL (ref 70–99)
Potassium: 3.8 mEq/L (ref 3.5–5.1)
Sodium: 141 mEq/L (ref 135–145)
Total Bilirubin: 0.4 mg/dL (ref 0.2–1.2)
Total Protein: 7.4 g/dL (ref 6.0–8.3)

## 2023-08-22 LAB — CBC WITH DIFFERENTIAL/PLATELET
Basophils Absolute: 0.1 10*3/uL (ref 0.0–0.1)
Basophils Relative: 0.8 % (ref 0.0–3.0)
Eosinophils Absolute: 0.3 10*3/uL (ref 0.0–0.7)
Eosinophils Relative: 3.6 % (ref 0.0–5.0)
HCT: 44.2 % (ref 36.0–46.0)
Hemoglobin: 14.5 g/dL (ref 12.0–15.0)
Lymphocytes Relative: 31.2 % (ref 12.0–46.0)
Lymphs Abs: 2.8 10*3/uL (ref 0.7–4.0)
MCHC: 32.7 g/dL (ref 30.0–36.0)
MCV: 96.4 fl (ref 78.0–100.0)
Monocytes Absolute: 0.5 10*3/uL (ref 0.1–1.0)
Monocytes Relative: 6.2 % (ref 3.0–12.0)
Neutro Abs: 5.1 10*3/uL (ref 1.4–7.7)
Neutrophils Relative %: 58.2 % (ref 43.0–77.0)
Platelets: 293 10*3/uL (ref 150.0–400.0)
RBC: 4.59 Mil/uL (ref 3.87–5.11)
RDW: 14.2 % (ref 11.5–15.5)
WBC: 8.8 10*3/uL (ref 4.0–10.5)

## 2023-08-22 LAB — HEMOGLOBIN A1C: Hgb A1c MFr Bld: 6.2 % (ref 4.6–6.5)

## 2023-08-22 LAB — LIPID PANEL
Cholesterol: 198 mg/dL (ref 0–200)
HDL: 59.1 mg/dL (ref >39.00)
LDL Cholesterol: 108 mg/dL — ABNORMAL HIGH (ref 0–99)
NonHDL: 139.02
Total CHOL/HDL Ratio: 3
Triglycerides: 157 mg/dL — ABNORMAL HIGH (ref 0.0–149.0)
VLDL: 31.4 mg/dL (ref 0.0–40.0)

## 2023-08-22 LAB — TSH: TSH: 0.87 u[IU]/mL (ref 0.35–5.50)

## 2023-08-22 MED ORDER — AMLODIPINE BESYLATE 5 MG PO TABS: 5.0000 mg | ORAL_TABLET | Freq: Every day | ORAL | 3 refills | Status: DC

## 2023-08-22 MED ORDER — SIMVASTATIN 20 MG PO TABS: ORAL_TABLET | ORAL | 3 refills | Status: DC

## 2023-08-22 MED ORDER — LINACLOTIDE 290 MCG PO CAPS: 290.0000 ug | ORAL_CAPSULE | Freq: Every day | ORAL | 3 refills | Status: DC

## 2023-08-22 MED ORDER — LEVOTHYROXINE SODIUM 88 MCG PO TABS: ORAL_TABLET | ORAL | 3 refills | Status: DC

## 2023-08-22 MED ORDER — LOSARTAN POTASSIUM 100 MG PO TABS: 100.0000 mg | ORAL_TABLET | Freq: Every day | ORAL | 3 refills | Status: DC

## 2023-08-22 NOTE — Patient Instructions (Signed)
A referral to the podiatrist was placed.  He is to expect a phone call about setting up this appointment.

## 2023-08-28 ENCOUNTER — Telehealth: Payer: Self-pay | Admitting: Family Medicine

## 2023-08-28 NOTE — Telephone Encounter (Signed)
Patient dropped off document  Research Study Form , to be filled out by provider. Patient requested to send it back via Call Patient to pick up within ASAP. Document is located in providers tray at front office.Please advise at Mobile [phone] (mobile)

## 2023-08-29 ENCOUNTER — Ambulatory Visit: Payer: Managed Care, Other (non HMO) | Admitting: Podiatry

## 2023-08-29 ENCOUNTER — Ambulatory Visit: Payer: Managed Care, Other (non HMO)

## 2023-08-29 DIAGNOSIS — M722 Plantar fascial fibromatosis: Secondary | ICD-10-CM | POA: Diagnosis not present

## 2023-08-29 MED ORDER — TRIAMCINOLONE ACETONIDE 10 MG/ML IJ SUSP
10.0000 mg | Freq: Once | INTRAMUSCULAR | Status: AC
Start: 2023-08-29 — End: 2023-08-29
  Administered 2023-08-29: 10 mg via INTRA_ARTICULAR

## 2023-08-29 MED ORDER — DICLOFENAC SODIUM 75 MG PO TBEC: 75.0000 mg | DELAYED_RELEASE_TABLET | Freq: Two times a day (BID) | ORAL | 2 refills | Status: DC

## 2023-08-29 NOTE — Patient Instructions (Signed)

## 2023-08-30 DIAGNOSIS — Z0279 Encounter for issue of other medical certificate: Secondary | ICD-10-CM

## 2023-08-30 NOTE — Progress Notes (Signed)
Subjective:   Patient ID: Savannah Hubbard, female   DOB: 67 y.o.   MRN: [id number]   HPI Patient presents with chronic pain in the bottom of both heels that has gotten worse recently.  Patient states at times the big toe on the left foot can hurt but it seems to be at the same time recently.  Patient does not smoke and does like to be active   Review of Systems  All other systems reviewed and are negative.       Objective:  Physical Exam Vitals and nursing note reviewed.  Constitutional:      Appearance: She is well-developed.  Pulmonary:     Effort: Pulmonary effort is normal.  Musculoskeletal:        General: Normal range of motion.  Skin:    General: Skin is warm.  Neurological:     Mental Status: She is alert.     Neurovascular status intact muscle strength found to be adequate range of motion within normal limits with patient found to have intense discomfort plantar aspect heel region bilateral with fluid around the medial band and moderate depression of the arch.  Patient is found to have good digital perfusion well-oriented x 3     Assessment:  Acute plantar fasciitis bilateral fluid buildup around the medial band     Plan:  H&P reviewed and recommended conservative treatment.  Today I went ahead did sterile prep injected the plantar fascia at insertion bilateral 3 mg Kenalog 5 mg Xylocaine instructed on supportive therapy stretching exercises reappoint to recheck

## 2023-09-04 NOTE — Telephone Encounter (Signed)
Matters addressed at [facility].

## 2023-09-12 ENCOUNTER — Ambulatory Visit: Payer: Managed Care, Other (non HMO) | Admitting: Podiatry

## 2023-09-12 ENCOUNTER — Encounter: Payer: Self-pay | Admitting: Podiatry

## 2023-09-12 VITALS — Ht 61.25 in | Wt 182.0 lb

## 2023-09-12 DIAGNOSIS — M722 Plantar fascial fibromatosis: Secondary | ICD-10-CM | POA: Diagnosis not present

## 2023-09-12 NOTE — Progress Notes (Signed)
Subjective:   Patient ID: Savannah Hubbard, female   DOB: 67 y.o.   MRN: [id number]   HPI Patient has developed reoccurrence of inflammation pain around the plantar fascia of both feet.  States this is much improved and she is very pleased so far with progress   ROS      Objective:  Physical Exam  Upon palpation mild discomfort but significant improvement from previous plantar fascial inflammation     Assessment:  Plan fasciitis bilateral which is improving     Plan:  H&P reviewed and I went ahead today I did discussion of treatment including physical therapy shoe gear modifications anti-inflammatories stretching.  Patient will be seen back as needed hopefully this will last her for a long time what we have done

## 2023-10-01 ENCOUNTER — Other Ambulatory Visit: Payer: Self-pay | Admitting: Family Medicine

## 2023-10-01 DIAGNOSIS — I10 Essential (primary) hypertension: Secondary | ICD-10-CM

## 2023-10-23 DIAGNOSIS — H401133 Primary open-angle glaucoma, bilateral, severe stage: Secondary | ICD-10-CM | POA: Diagnosis not present

## 2023-11-09 ENCOUNTER — Other Ambulatory Visit: Payer: Self-pay | Admitting: Family Medicine

## 2023-11-09 DIAGNOSIS — I10 Essential (primary) hypertension: Secondary | ICD-10-CM

## 2024-03-09 DIAGNOSIS — H401133 Primary open-angle glaucoma, bilateral, severe stage: Secondary | ICD-10-CM | POA: Diagnosis not present

## 2024-04-27 ENCOUNTER — Encounter: Payer: Self-pay | Admitting: Podiatry

## 2024-04-27 ENCOUNTER — Ambulatory Visit: Admitting: Podiatry

## 2024-04-27 ENCOUNTER — Ambulatory Visit (INDEPENDENT_AMBULATORY_CARE_PROVIDER_SITE_OTHER)

## 2024-04-27 VITALS — Ht 61.25 in | Wt 182.0 lb

## 2024-04-27 DIAGNOSIS — H401133 Primary open-angle glaucoma, bilateral, severe stage: Secondary | ICD-10-CM | POA: Diagnosis not present

## 2024-04-27 DIAGNOSIS — M722 Plantar fascial fibromatosis: Secondary | ICD-10-CM

## 2024-04-27 MED ORDER — TRIAMCINOLONE ACETONIDE 10 MG/ML IJ SUSP
10.0000 mg | Freq: Once | INTRAMUSCULAR | Status: AC
  Administered 2024-04-27: 10 mg via INTRA_ARTICULAR

## 2024-04-27 NOTE — Progress Notes (Signed)
 Subjective:   Patient ID: Savannah Hubbard, female   DOB: 68 y.o.   MRN: [id number]   HPI Patient states that she is having a lot of pain in the heels of both feet and that it is worse on the right over the left.  States that it seems to be more of an issue and that she has had an issue with it for the last several months   ROS      Objective:  Physical Exam  Neurovascular status intact muscle strength adequate range of motion within normal limits with patient found to have exquisite discomfort in the medial fascial band of the heel bilateral fluid buildup was noted and patient is not able to stretch her foot properly     Assessment:  Acute plantar fasciitis right over left fluid buildup is noted with pain with patient who does have a tight Achilles tendon     Plan:  H&P reviewed at this point sterile prep I did inject the plantar fascia bilateral 3 mg Kenalog  5 mg Xylocaine I then dispensed a night splint properly fitted to the lower leg that I want her to wear.  She will probably use it more right than left I dispensed ice packs to use at the same time and she will be seen back as symptoms indicate

## 2024-06-12 ENCOUNTER — Encounter: Payer: Self-pay | Admitting: Advanced Practice Midwife

## 2024-06-29 DIAGNOSIS — H401133 Primary open-angle glaucoma, bilateral, severe stage: Secondary | ICD-10-CM | POA: Diagnosis not present

## 2024-08-03 DIAGNOSIS — H401133 Primary open-angle glaucoma, bilateral, severe stage: Secondary | ICD-10-CM | POA: Diagnosis not present

## 2024-08-22 ENCOUNTER — Other Ambulatory Visit: Payer: Self-pay | Admitting: Family Medicine

## 2024-08-22 DIAGNOSIS — E042 Nontoxic multinodular goiter: Secondary | ICD-10-CM

## 2024-08-24 ENCOUNTER — Ambulatory Visit: Admitting: Family Medicine

## 2024-08-24 ENCOUNTER — Encounter: Payer: Self-pay | Admitting: Family Medicine

## 2024-08-24 VITALS — BP 140/80 | HR 56 | Temp 98.3°F | Ht 61.25 in | Wt 179.2 lb

## 2024-08-24 DIAGNOSIS — Z0000 Encounter for general adult medical examination without abnormal findings: Secondary | ICD-10-CM

## 2024-08-24 DIAGNOSIS — I10 Essential (primary) hypertension: Secondary | ICD-10-CM | POA: Diagnosis not present

## 2024-08-24 DIAGNOSIS — Z780 Asymptomatic menopausal state: Secondary | ICD-10-CM | POA: Diagnosis not present

## 2024-08-24 DIAGNOSIS — Z85528 Personal history of other malignant neoplasm of kidney: Secondary | ICD-10-CM | POA: Diagnosis not present

## 2024-08-24 DIAGNOSIS — E042 Nontoxic multinodular goiter: Secondary | ICD-10-CM | POA: Diagnosis not present

## 2024-08-24 DIAGNOSIS — E782 Mixed hyperlipidemia: Secondary | ICD-10-CM | POA: Diagnosis not present

## 2024-08-24 DIAGNOSIS — Z23 Encounter for immunization: Secondary | ICD-10-CM | POA: Diagnosis not present

## 2024-08-24 LAB — CBC WITH DIFFERENTIAL/PLATELET
Basophils Absolute: 0.1 K/uL (ref 0.0–0.1)
Basophils Relative: 1.1 % (ref 0.0–3.0)
Eosinophils Absolute: 0.4 K/uL (ref 0.0–0.7)
Eosinophils Relative: 5.5 % — ABNORMAL HIGH (ref 0.0–5.0)
HCT: 44.7 % (ref 36.0–46.0)
Hemoglobin: 15.1 g/dL — ABNORMAL HIGH (ref 12.0–15.0)
Lymphocytes Relative: 35.7 % (ref 12.0–46.0)
Lymphs Abs: 2.7 K/uL (ref 0.7–4.0)
MCHC: 33.9 g/dL (ref 30.0–36.0)
MCV: 96.9 fl (ref 78.0–100.0)
Monocytes Absolute: 0.5 K/uL (ref 0.1–1.0)
Monocytes Relative: 7 % (ref 3.0–12.0)
Neutro Abs: 3.9 K/uL (ref 1.4–7.7)
Neutrophils Relative %: 50.7 % (ref 43.0–77.0)
Platelets: 266 K/uL (ref 150.0–400.0)
RBC: 4.61 Mil/uL (ref 3.87–5.11)
RDW: 12.8 % (ref 11.5–15.5)
WBC: 7.7 K/uL (ref 4.0–10.5)

## 2024-08-24 LAB — COMPREHENSIVE METABOLIC PANEL WITH GFR
ALT: 21 U/L (ref 0–35)
AST: 22 U/L (ref 0–37)
Albumin: 3.9 g/dL (ref 3.5–5.2)
Alkaline Phosphatase: 81 U/L (ref 39–117)
BUN: 13 mg/dL (ref 6–23)
CO2: 29 meq/L (ref 19–32)
Calcium: 9.6 mg/dL (ref 8.4–10.5)
Chloride: 102 meq/L (ref 96–112)
Creatinine, Ser: 0.96 mg/dL (ref 0.40–1.20)
GFR: 61.76 mL/min (ref >60.00)
Glucose, Bld: 95 mg/dL (ref 70–99)
Potassium: 3.8 meq/L (ref 3.5–5.1)
Sodium: 138 meq/L (ref 135–145)
Total Bilirubin: 0.6 mg/dL (ref 0.2–1.2)
Total Protein: 8.1 g/dL (ref 6.0–8.3)

## 2024-08-24 LAB — LIPID PANEL
Cholesterol: 175 mg/dL (ref 0–200)
HDL: 42.3 mg/dL (ref >39.00)
LDL Cholesterol: 110 mg/dL — ABNORMAL HIGH (ref 0–99)
NonHDL: 132.58
Total CHOL/HDL Ratio: 4
Triglycerides: 113 mg/dL (ref 0.0–149.0)
VLDL: 22.6 mg/dL (ref 0.0–40.0)

## 2024-08-24 LAB — TSH: TSH: 0.51 u[IU]/mL (ref 0.35–5.50)

## 2024-08-24 LAB — T4, FREE: Free T4: 0.91 ng/dL (ref 0.60–1.60)

## 2024-08-24 MED ORDER — SPIRONOLACTONE 25 MG PO TABS: 25.0000 mg | ORAL_TABLET | Freq: Every day | ORAL | 3 refills | Status: AC

## 2024-08-24 NOTE — Progress Notes (Signed)
 Established Patient Office Visit   Subjective  Patient ID: Savannah Hubbard, female    DOB: 1956-07-19  Age: 68 y.o. MRN: [id number]  Chief Complaint  Patient presents with   Annual Exam    Pt is a 67 yo female seen for CPE.  Pt doing well overall.  BP at home 150s/80s. Denies HA, CP, LE edema.  Pt inquires about additional testing due to h/o renal ca multinodular goiter.  Denies dysphagia, noticeable edema, throat irritation.  Taking synthroid  88 mcg.    Patient Active Problem List   Diagnosis Date Noted   Flatulence, eructation and gas pain 08/21/2023   Proctalgia 08/21/2023   Pain in joint of right hip 02/08/2022   Pain in joint of right shoulder 02/08/2022   Prediabetes 12/29/2020   Dyspareunia in female 03/22/2020   Atrophic vaginitis 03/22/2020   Vaginal dryness 03/22/2020   Encounter for medication refill 03/21/2020   Class 1 obesity 03/21/2020   Seasonal allergic rhinitis 03/21/2020   Acquired hypothyroidism 03/21/2020   S/p nephrectomy 05/20/2017   Colon cancer screening 06/30/2015   Primary open angle glaucoma of both eyes, severe stage 10/25/2013   Renal cell carcinoma (HCC) 05/19/2013   Constipation 04/02/2012   DERMATITIS, ALLERGIC 05/05/2009   Overweight 02/17/2009   Venous (peripheral) insufficiency 02/17/2009   Hypercholesterolemia 09/19/2008   Non-toxic multinodular goiter 07/21/2008   Osteoarthritis 07/21/2008   Essential hypertension 01/20/2008   Esophageal reflux 01/20/2008   Irritable bowel syndrome 01/20/2008   Low back pain 01/20/2008   Past Medical History:  Diagnosis Date   Allergy 2005   On chart   DJD (degenerative joint disease)    GERD (gastroesophageal reflux disease)    Glaucoma 2010   Hypercholesteremia    Hypertension    IBS (irritable bowel syndrome)    Lumbar back pain    Nontoxic multinodular goiter    Overweight(278.02)    Renal cell cancer (HCC)    Venous insufficiency    Past Surgical History:  Procedure Laterality  Date   ABDOMINAL HYSTERECTOMY     CESAREAN SECTION  1993 & 1996   CHOLECYSTECTOMY  1993   left nephrectomy for renal cell cancer  11/27/2003   Dr. Alline   Social History   Tobacco Use   Smoking status: Never   Smokeless tobacco: Never  Substance Use Topics   Alcohol use: No   Drug use: No   Family History  Problem Relation Age of Onset   Diabetes Mother    Hypertension Mother    Heart disease Father    Hypertension Father    Stroke Father    Diabetes Brother    Heart disease Brother    Hypertension Brother    Diabetes Sister    Hypertension Sister    Kidney disease Sister    Allergies  Allergen Reactions   Lisinopril     angioedema   Morphine Hives, Itching and Rash    REACTION: ITCHING   Sulfamethoxazole-Trimethoprim Other (See Comments)    Drops turn eyes very red   Morphine Sulfate Other (See Comments)   Sulfonamide Derivatives Other (See Comments)    ROS Negative unless stated above    Objective:     BP (!) 140/80 (BP Location: Left Arm, Patient Position: Sitting, Cuff Size: Large)   Pulse (!) 56   Temp 98.3 F (36.8 C) (Oral)   Ht 5' 1.25 (1.556 m)   Wt 179 lb 3.2 oz (81.3 kg)   SpO2 98%   BMI 33.58  kg/m  BP Readings from Last 3 Encounters:  08/24/24 (!) 140/80  08/22/23 122/76  12/17/22 (!) 143/79   Wt Readings from Last 3 Encounters:  08/24/24 179 lb 3.2 oz (81.3 kg)  04/27/24 182 lb (82.6 kg)  09/12/23 182 lb (82.6 kg)      Physical Exam Constitutional:      Appearance: Normal appearance.  HENT:     Head: Normocephalic and atraumatic.     Right Ear: Tympanic membrane, ear canal and external ear normal.     Left Ear: Tympanic membrane, ear canal and external ear normal.     Nose: Nose normal.     Mouth/Throat:     Mouth: Mucous membranes are moist.     Pharynx: No oropharyngeal exudate or posterior oropharyngeal erythema.  Eyes:     General: No scleral icterus.    Extraocular Movements: Extraocular movements intact.      Conjunctiva/sclera: Conjunctivae normal.     Pupils: Pupils are equal, round, and reactive to light.  Neck:     Thyroid : No thyromegaly or thyroid  tenderness.     Vascular: No carotid bruit.  Cardiovascular:     Rate and Rhythm: Normal rate and regular rhythm.     Pulses: Normal pulses.     Heart sounds: Normal heart sounds. No murmur heard.    No friction rub.  Pulmonary:     Effort: Pulmonary effort is normal.     Breath sounds: Normal breath sounds. No wheezing, rhonchi or rales.  Abdominal:     General: Bowel sounds are normal.     Palpations: Abdomen is soft.     Tenderness: There is no abdominal tenderness.  Musculoskeletal:        General: No deformity. Normal range of motion.  Lymphadenopathy:     Cervical: No cervical adenopathy.  Skin:    General: Skin is warm and dry.     Findings: No lesion.  Neurological:     General: No focal deficit present.     Mental Status: She is alert and oriented to person, place, and time.  Psychiatric:        Mood and Affect: Mood normal.        Thought Content: Thought content normal.        08/24/2024   11:38 AM 08/22/2023   10:16 AM 08/15/2022    8:22 AM  Depression screen PHQ 2/9  Decreased Interest 1 0 0  Down, Depressed, Hopeless 0 0 0  PHQ - 2 Score 1 0 0  Altered sleeping 0 0   Tired, decreased energy 0 0   Change in appetite 1 0   Feeling bad or failure about yourself  0 0   Trouble concentrating 0 0   Moving slowly or fidgety/restless 0 0   Suicidal thoughts 0 0   PHQ-9 Score 2 0   Difficult doing work/chores Not difficult at all Not difficult at all       08/24/2024   11:38 AM 08/22/2023   10:16 AM 08/10/2021   10:23 AM  GAD 7 : Generalized Anxiety Score  Nervous, Anxious, on Edge 0 0 0  Control/stop worrying 0 0 0  Worry too much - different things 0 0 0  Trouble relaxing 0 0 0  Restless 0 0 0  Easily annoyed or irritable 0 0 0  Afraid - awful might happen 0 0 0  Total GAD 7 Score 0 0 0  Anxiety Difficulty   Not difficult at all  No results found for any visits on 08/24/24.    Assessment & Plan:   Well adult exam - Plan: CBC with Differential/Platelet, Comprehensive metabolic panel with GFR, Hemoglobin A1c, Lipid panel, T4, free, TSH, TSH, T4, free, Lipid panel, Hemoglobin A1c, Comprehensive metabolic panel with GFR, CBC with Differential/Platelet  Essential hypertension - Plan: Comprehensive metabolic panel with GFR, T4, free, TSH, spironolactone (ALDACTONE) 25 MG tablet, TSH, T4, free, Comprehensive metabolic panel with GFR  Mixed hyperlipidemia - Plan: Comprehensive metabolic panel with GFR, Lipid panel, Lipid panel, Comprehensive metabolic panel with GFR  Need for influenza vaccination - Plan: Flu vaccine HIGH DOSE PF(Fluzone Trivalent)  Personal history of renal cancer - Plan: Comprehensive metabolic panel with GFR, VITAMIN D  25 Hydroxy (Vit-D Deficiency, Fractures), DG Chest 2 View, VITAMIN D  25 Hydroxy (Vit-D Deficiency, Fractures), Comprehensive metabolic panel with GFR, CANCELED: DG Chest 2 View  Asymptomatic postmenopausal state - Plan: DG Bone Density  Non-toxic multinodular goiter - Plan: T4, free, TSH, TSH, T4, free  Age-appropriate health screenings discussed.  Obtain labs.  Immunizations reviewed.  Influenza vaccine given this visit.  Pap not indicated 2/2 history of hysterectomy.  Bone density scan ordered.  Patient to schedule mammogram.  CXR ordered.  BP elevated.  Discussed importance of better control.  Continue current medications including losartan  100 mg daily, Norvasc  5 mg daily.  Start spironolactone 25 mg daily.  Monitor bp at home and keep a lo to bring to clinic in 5 wks.  Continue synthroid  88 mcg.  Dose change if needed based on lab results.  Return in about 5 weeks (around 09/28/2024).   Clotilda JONELLE Single, MD

## 2024-08-25 LAB — VITAMIN D 25 HYDROXY (VIT D DEFICIENCY, FRACTURES): VITD: 37.49 ng/mL (ref 30.00–100.00)

## 2024-08-25 LAB — HEMOGLOBIN A1C
Hgb A1c MFr Bld: 6 % — ABNORMAL HIGH (ref <5.7)
Mean Plasma Glucose: 126 mg/dL
eAG (mmol/L): 7 mmol/L

## 2024-08-27 ENCOUNTER — Ambulatory Visit
Admission: RE | Admit: 2024-08-27 | Discharge: 2024-08-27 | Disposition: A | Source: Ambulatory Visit | Attending: Family Medicine

## 2024-08-27 DIAGNOSIS — Z0000 Encounter for general adult medical examination without abnormal findings: Secondary | ICD-10-CM | POA: Diagnosis not present

## 2024-08-27 DIAGNOSIS — Z85528 Personal history of other malignant neoplasm of kidney: Secondary | ICD-10-CM | POA: Diagnosis not present

## 2024-08-27 DIAGNOSIS — I10 Essential (primary) hypertension: Secondary | ICD-10-CM | POA: Diagnosis not present

## 2024-08-27 DIAGNOSIS — Z780 Asymptomatic menopausal state: Secondary | ICD-10-CM

## 2024-09-02 ENCOUNTER — Ambulatory Visit: Payer: Self-pay | Admitting: Family Medicine

## 2024-09-15 ENCOUNTER — Other Ambulatory Visit: Payer: Self-pay | Admitting: Family Medicine

## 2024-09-15 DIAGNOSIS — I10 Essential (primary) hypertension: Secondary | ICD-10-CM

## 2024-09-15 DIAGNOSIS — E782 Mixed hyperlipidemia: Secondary | ICD-10-CM

## 2024-09-17 ENCOUNTER — Telehealth: Payer: Self-pay | Admitting: *Deleted

## 2024-09-17 NOTE — Telephone Encounter (Signed)
 See result note.

## 2024-09-17 NOTE — Telephone Encounter (Signed)
 Copied from CRM [id number]. Topic: Clinical - Lab/Test Results >> Sep 17, 2024  3:13 PM Mia F wrote: Reason for CRM: Rea called pt to relay bone density results. CAL did not come back to phone to have pt speak to her. Pt asks if Rea can call her back

## 2024-09-28 ENCOUNTER — Ambulatory Visit (INDEPENDENT_AMBULATORY_CARE_PROVIDER_SITE_OTHER): Admitting: Family Medicine

## 2024-09-28 VITALS — BP 138/86 | HR 75 | Temp 98.5°F | Ht 61.25 in | Wt 181.4 lb

## 2024-09-28 DIAGNOSIS — I10 Essential (primary) hypertension: Secondary | ICD-10-CM

## 2024-09-28 DIAGNOSIS — J3489 Other specified disorders of nose and nasal sinuses: Secondary | ICD-10-CM

## 2024-09-28 DIAGNOSIS — J00 Acute nasopharyngitis [common cold]: Secondary | ICD-10-CM | POA: Diagnosis not present

## 2024-09-28 DIAGNOSIS — R051 Acute cough: Secondary | ICD-10-CM | POA: Diagnosis not present

## 2024-09-28 DIAGNOSIS — K581 Irritable bowel syndrome with constipation: Secondary | ICD-10-CM

## 2024-09-28 LAB — POC COVID19 BINAXNOW: SARS Coronavirus 2 Ag: NEGATIVE

## 2024-09-28 LAB — POCT INFLUENZA A/B
Influenza A, POC: NEGATIVE
Influenza B, POC: NEGATIVE

## 2024-09-28 MED ORDER — LINACLOTIDE 290 MCG PO CAPS: 290.0000 ug | ORAL_CAPSULE | Freq: Every day | ORAL | 3 refills | Status: AC

## 2024-09-28 MED ORDER — FLUTICASONE PROPIONATE 50 MCG/ACT NA SUSP: 1.0000 | Freq: Every day | NASAL | 0 refills | Status: AC

## 2024-09-28 MED ORDER — BENZONATATE 100 MG PO CAPS: 100.0000 mg | ORAL_CAPSULE | Freq: Two times a day (BID) | ORAL | 0 refills | Status: AC | PRN

## 2024-09-28 NOTE — Progress Notes (Signed)
 Established Patient Office Visit   Subjective  Patient ID: Savannah Hubbard, female    DOB: 07/22/1956  Age: 68 y.o. MRN: [id number]  Chief Complaint  Patient presents with   New Patient (Initial Visit)   Medical Management of Chronic Issues    Blood Pressure recheck, Cough and congestion, started Thursday 10/30,     Pt is a 68 yo female seen for aute concern and f/u on HTN.  Since last OFV pt states bp 125/74, 130/70s-80s.  The highest reading was 200s systolic prior to starting new med, spironolactone 25 mg daily.  Also taking norvasc  5 mg and losartan  100 mg.    Pt notes productive cough, rhinorrhea, and sore throat that started 4 days ago.  ST has since resolved.  Sputum/mucus was brownish, but now mostly clear.  Had loose stools this am.  Denies fever, ear pain/pressure, facial pain/pressure, chills.  May have been around others who were sick.  Tried Dayquil cold and flu for high bp.  Helps with cough until wears off.  Pt states she needs refills on meds.  Informed all of the meds filled by this provider have been sent to the pharmacy with exception of linzess  and meds never filled by this provider.  Pt having daily or every other day BMs since taking a shake in the mornings.  Likes to have linzess  just in case.      Patient Active Problem List   Diagnosis Date Noted   Flatulence, eructation and gas pain 08/21/2023   Proctalgia 08/21/2023   Pain in joint of right hip 02/08/2022   Pain in joint of right shoulder 02/08/2022   Prediabetes 12/29/2020   Dyspareunia in female 03/22/2020   Atrophic vaginitis 03/22/2020   Vaginal dryness 03/22/2020   Encounter for medication refill 03/21/2020   Class 1 obesity 03/21/2020   Seasonal allergic rhinitis 03/21/2020   Acquired hypothyroidism 03/21/2020   S/p nephrectomy 05/20/2017   Colon cancer screening 06/30/2015   Primary open angle glaucoma of both eyes, severe stage 10/25/2013   Renal cell carcinoma (HCC) 05/19/2013    Constipation 04/02/2012   DERMATITIS, ALLERGIC 05/05/2009   Overweight 02/17/2009   Venous (peripheral) insufficiency 02/17/2009   Hypercholesterolemia 09/19/2008   Non-toxic multinodular goiter 07/21/2008   Osteoarthritis 07/21/2008   Essential hypertension 01/20/2008   Esophageal reflux 01/20/2008   Irritable bowel syndrome 01/20/2008   Low back pain 01/20/2008   Past Medical History:  Diagnosis Date   Allergy 2005   On chart   DJD (degenerative joint disease)    GERD (gastroesophageal reflux disease)    Glaucoma 2010   Hypercholesteremia    Hypertension    IBS (irritable bowel syndrome)    Lumbar back pain    Nontoxic multinodular goiter    Overweight(278.02)    Renal cell cancer (HCC)    Venous insufficiency    Past Surgical History:  Procedure Laterality Date   ABDOMINAL HYSTERECTOMY     CESAREAN SECTION  1993 & 1996   CHOLECYSTECTOMY  1993   left nephrectomy for renal cell cancer  11/27/2003   Dr. Alline   Social History   Tobacco Use   Smoking status: Never   Smokeless tobacco: Never  Substance Use Topics   Alcohol use: No   Drug use: No   Family History  Problem Relation Age of Onset   Diabetes Mother    Hypertension Mother    Heart disease Father    Hypertension Father    Stroke Father  Diabetes Brother    Heart disease Brother    Hypertension Brother    Diabetes Sister    Hypertension Sister    Kidney disease Sister    Allergies  Allergen Reactions   Lisinopril     angioedema   Morphine Hives, Itching and Rash    REACTION: ITCHING   Sulfamethoxazole-Trimethoprim Other (See Comments)    Drops turn eyes very red   Morphine Sulfate Other (See Comments)   Sulfonamide Derivatives Other (See Comments)    ROS Negative unless stated above    Objective:     BP (!) 154/80 (BP Location: Left Arm, Patient Position: Sitting, Cuff Size: Large)   Pulse 75   Temp 98.5 F (36.9 C) (Oral)   Ht 5' 1.25 (1.556 m)   Wt 181 lb 6.4 oz (82.3 kg)    SpO2 99%   BMI 34.00 kg/m  BP Readings from Last 3 Encounters:  09/28/24 (!) 154/80  08/24/24 (!) 140/80  08/22/23 122/76   Wt Readings from Last 3 Encounters:  09/28/24 181 lb 6.4 oz (82.3 kg)  08/24/24 179 lb 3.2 oz (81.3 kg)  04/27/24 182 lb (82.6 kg)      Physical Exam Constitutional:      General: She is not in acute distress.    Appearance: Normal appearance.  HENT:     Head: Normocephalic and atraumatic.     Right Ear: Tympanic membrane normal.     Left Ear: Tympanic membrane normal.     Nose: Nose normal.     Mouth/Throat:     Mouth: Mucous membranes are moist.     Comments: Hoarse sounding voice. Cardiovascular:     Rate and Rhythm: Normal rate and regular rhythm.     Heart sounds: Normal heart sounds. No murmur heard.    No gallop.  Pulmonary:     Effort: Pulmonary effort is normal. No respiratory distress.     Breath sounds: Normal breath sounds. No decreased air movement. No decreased breath sounds, wheezing, rhonchi or rales.     Comments: Deep productive sounding cough.   Skin:    General: Skin is warm and dry.  Neurological:     Mental Status: She is alert and oriented to person, place, and time.        09/28/2024   11:51 AM 08/24/2024   11:38 AM 08/22/2023   10:16 AM  Depression screen PHQ 2/9  Decreased Interest 0 1 0  Down, Depressed, Hopeless 0 0 0  PHQ - 2 Score 0 1 0  Altered sleeping 0 0 0  Tired, decreased energy 0 0 0  Change in appetite 0 1 0  Feeling bad or failure about yourself  0 0 0  Trouble concentrating 0 0 0  Moving slowly or fidgety/restless 0 0 0  Suicidal thoughts 0 0 0  PHQ-9 Score 0 2 0  Difficult doing work/chores  Not difficult at all Not difficult at all      09/28/2024   11:52 AM 08/24/2024   11:38 AM 08/22/2023   10:16 AM 08/10/2021   10:23 AM  GAD 7 : Generalized Anxiety Score  Nervous, Anxious, on Edge 0 0 0 0  Control/stop worrying 0 0 0 0  Worry too much - different things 0 0 0 0  Trouble relaxing 0 0 0  0  Restless 0 0 0 0  Easily annoyed or irritable 0 0 0 0  Afraid - awful might happen 0 0 0 0  Total GAD 7  Score 0 0 0 0  Anxiety Difficulty   Not difficult at all      Results for orders placed or performed in visit on 09/28/24  POC COVID-19 BinaxNow  Result Value Ref Range   SARS Coronavirus 2 Ag Negative Negative  POC Influenza A/B  Result Value Ref Range   Influenza A, POC Negative Negative   Influenza B, POC Negative Negative      Assessment & Plan:   Acute nasopharyngitis -     Fluticasone Propionate; Place 1 spray into both nostrils daily.  Dispense: 16 g; Refill: 0  Acute cough -     POC COVID-19 BinaxNow -     POCT Influenza A/B -     Benzonatate; Take 1 capsule (100 mg total) by mouth 2 (two) times daily as needed for cough.  Dispense: 20 capsule; Refill: 0  Essential hypertension  Rhinorrhea -     Fluticasone Propionate; Place 1 spray into both nostrils daily.  Dispense: 16 g; Refill: 0  Irritable bowel syndrome with constipation -     linaCLOtide ; Take 1 capsule (290 mcg total) by mouth daily.  Dispense: 90 capsule; Refill: 3  Acute URI sx likely viral in etiology.  POC COVID and flu negative.  Supportive care including OTC meds, rest, hydration, etc.  Flonase and tessalon sent to pharm.  BP elevated likely 2/2 coughing and cold meds.  Use cold meds for ppl with HTN.  Continue norvasc  5 mg, losartan  100 mg, and spironolactone 25 mg daily.  Lifestyle modifications.  Advised new rxs were previously sent to pharm with refills.  F/u prn for continued/worsened sx.    Return in about 3 months (around 12/29/2024), or if symptoms worsen or fail to improve, for blood pressure.   Clotilda JONELLE Single, MD

## 2024-09-28 NOTE — Patient Instructions (Signed)
 A prescription for tessalon was sent to the pharmacy for cough along with a prescription for flonase.    The tessalon was also refilled in case you need it.  COVID and flu tests were negative.  Continue monitoring your blood pressure at home.  We will recheck it in about 2-3 months.  Notify clinic for readings consistently greater than 140/90 (either number).

## 2024-12-30 ENCOUNTER — Ambulatory Visit: Admitting: Family Medicine

## 2025-01-07 ENCOUNTER — Ambulatory Visit: Admitting: Family Medicine

## 2025-04-16 ENCOUNTER — Ambulatory Visit
